# Patient Record
Sex: Female | Born: 1940 | Race: Black or African American | Hispanic: No | Marital: Married | Smoking: Never smoker
Health system: Southern US, Community
[De-identification: ages and names within clinical notes are randomized; demographics above are authoritative.]

## PROBLEM LIST (undated history)

## (undated) DIAGNOSIS — O2230 Deep phlebothrombosis in pregnancy, unspecified trimester: Secondary | ICD-10-CM

## (undated) DIAGNOSIS — R7611 Nonspecific reaction to tuberculin skin test without active tuberculosis: Secondary | ICD-10-CM

## (undated) DIAGNOSIS — E785 Hyperlipidemia, unspecified: Secondary | ICD-10-CM

## (undated) DIAGNOSIS — M1990 Unspecified osteoarthritis, unspecified site: Secondary | ICD-10-CM

## (undated) DIAGNOSIS — G709 Myoneural disorder, unspecified: Secondary | ICD-10-CM

## (undated) DIAGNOSIS — G4733 Obstructive sleep apnea (adult) (pediatric): Secondary | ICD-10-CM

## (undated) DIAGNOSIS — I10 Essential (primary) hypertension: Secondary | ICD-10-CM

## (undated) HISTORY — DX: Nonspecific reaction to tuberculin skin test without active tuberculosis: R76.11

## (undated) HISTORY — DX: Obstructive sleep apnea (adult) (pediatric): G47.33

## (undated) HISTORY — PX: BREAST SURGERY: SHX581

## (undated) HISTORY — DX: Hyperlipidemia, unspecified: E78.5

---

## 1962-11-29 HISTORY — PX: ABDOMINAL HYSTERECTOMY: SHX81

## 1963-11-30 HISTORY — PX: CHOLECYSTECTOMY: SHX55

## 2004-01-28 ENCOUNTER — Encounter (INDEPENDENT_AMBULATORY_CARE_PROVIDER_SITE_OTHER): Payer: Self-pay | Admitting: *Deleted

## 2004-01-28 LAB — CONVERTED CEMR LAB

## 2004-03-30 ENCOUNTER — Encounter: Admission: RE | Admit: 2004-03-30 | Discharge: 2004-03-30 | Payer: Self-pay | Admitting: Sports Medicine

## 2004-04-07 ENCOUNTER — Encounter: Admission: RE | Admit: 2004-04-07 | Discharge: 2004-04-07 | Payer: Self-pay | Admitting: Family Medicine

## 2004-04-20 ENCOUNTER — Encounter: Admission: RE | Admit: 2004-04-20 | Discharge: 2004-04-20 | Payer: Self-pay | Admitting: Family Medicine

## 2004-09-15 ENCOUNTER — Ambulatory Visit: Payer: Self-pay | Admitting: Sports Medicine

## 2004-10-06 ENCOUNTER — Ambulatory Visit: Payer: Self-pay | Admitting: Family Medicine

## 2004-10-16 ENCOUNTER — Ambulatory Visit: Payer: Self-pay | Admitting: Sports Medicine

## 2004-11-09 ENCOUNTER — Emergency Department (HOSPITAL_COMMUNITY): Admission: EM | Admit: 2004-11-09 | Discharge: 2004-11-09 | Payer: Self-pay | Admitting: *Deleted

## 2004-11-16 ENCOUNTER — Ambulatory Visit: Payer: Self-pay | Admitting: Sports Medicine

## 2005-02-26 ENCOUNTER — Ambulatory Visit (HOSPITAL_COMMUNITY): Admission: RE | Admit: 2005-02-26 | Discharge: 2005-02-26 | Payer: Self-pay | Admitting: Family Medicine

## 2005-03-21 ENCOUNTER — Emergency Department (HOSPITAL_COMMUNITY): Admission: EM | Admit: 2005-03-21 | Discharge: 2005-03-21 | Payer: Self-pay | Admitting: Emergency Medicine

## 2005-03-26 ENCOUNTER — Ambulatory Visit: Payer: Self-pay | Admitting: Family Medicine

## 2005-03-30 ENCOUNTER — Ambulatory Visit: Payer: Self-pay | Admitting: Family Medicine

## 2005-06-11 ENCOUNTER — Ambulatory Visit: Payer: Self-pay | Admitting: Family Medicine

## 2005-07-12 ENCOUNTER — Ambulatory Visit: Payer: Self-pay | Admitting: Family Medicine

## 2005-08-20 ENCOUNTER — Ambulatory Visit: Payer: Self-pay | Admitting: Family Medicine

## 2005-08-30 ENCOUNTER — Ambulatory Visit: Payer: Self-pay | Admitting: Family Medicine

## 2005-09-17 ENCOUNTER — Ambulatory Visit (HOSPITAL_COMMUNITY): Admission: RE | Admit: 2005-09-17 | Discharge: 2005-09-17 | Payer: Self-pay | Admitting: Gastroenterology

## 2005-09-17 ENCOUNTER — Encounter (INDEPENDENT_AMBULATORY_CARE_PROVIDER_SITE_OTHER): Payer: Self-pay | Admitting: Specialist

## 2006-01-31 ENCOUNTER — Ambulatory Visit: Payer: Self-pay | Admitting: Sports Medicine

## 2006-02-26 ENCOUNTER — Emergency Department (HOSPITAL_COMMUNITY): Admission: EM | Admit: 2006-02-26 | Discharge: 2006-02-27 | Payer: Self-pay | Admitting: Emergency Medicine

## 2006-03-24 ENCOUNTER — Ambulatory Visit: Payer: Self-pay | Admitting: Family Medicine

## 2006-03-25 ENCOUNTER — Encounter: Admission: RE | Admit: 2006-03-25 | Discharge: 2006-03-25 | Payer: Self-pay | Admitting: Sports Medicine

## 2006-04-07 ENCOUNTER — Ambulatory Visit: Payer: Self-pay | Admitting: Family Medicine

## 2006-04-26 ENCOUNTER — Ambulatory Visit (HOSPITAL_COMMUNITY): Admission: RE | Admit: 2006-04-26 | Discharge: 2006-04-26 | Payer: Self-pay | Admitting: Sports Medicine

## 2006-05-09 ENCOUNTER — Ambulatory Visit: Payer: Self-pay | Admitting: Sports Medicine

## 2006-07-29 ENCOUNTER — Ambulatory Visit: Payer: Self-pay | Admitting: Family Medicine

## 2006-09-23 ENCOUNTER — Emergency Department (HOSPITAL_COMMUNITY): Admission: EM | Admit: 2006-09-23 | Discharge: 2006-09-23 | Payer: Self-pay | Admitting: Family Medicine

## 2007-01-26 DIAGNOSIS — M25519 Pain in unspecified shoulder: Secondary | ICD-10-CM

## 2007-01-26 DIAGNOSIS — E669 Obesity, unspecified: Secondary | ICD-10-CM

## 2007-01-26 DIAGNOSIS — K210 Gastro-esophageal reflux disease with esophagitis: Secondary | ICD-10-CM

## 2007-01-26 DIAGNOSIS — I10 Essential (primary) hypertension: Secondary | ICD-10-CM

## 2007-01-26 DIAGNOSIS — J309 Allergic rhinitis, unspecified: Secondary | ICD-10-CM | POA: Insufficient documentation

## 2007-01-27 ENCOUNTER — Encounter (INDEPENDENT_AMBULATORY_CARE_PROVIDER_SITE_OTHER): Payer: Self-pay | Admitting: *Deleted

## 2007-05-01 ENCOUNTER — Ambulatory Visit (HOSPITAL_COMMUNITY): Admission: RE | Admit: 2007-05-01 | Discharge: 2007-05-01 | Payer: Self-pay | Admitting: Internal Medicine

## 2007-05-16 ENCOUNTER — Ambulatory Visit (HOSPITAL_BASED_OUTPATIENT_CLINIC_OR_DEPARTMENT_OTHER): Admission: RE | Admit: 2007-05-16 | Discharge: 2007-05-16 | Payer: Self-pay | Admitting: Internal Medicine

## 2007-05-21 ENCOUNTER — Ambulatory Visit: Payer: Self-pay | Admitting: Internal Medicine

## 2007-07-19 ENCOUNTER — Ambulatory Visit (HOSPITAL_BASED_OUTPATIENT_CLINIC_OR_DEPARTMENT_OTHER): Admission: RE | Admit: 2007-07-19 | Discharge: 2007-07-19 | Payer: Self-pay | Admitting: Internal Medicine

## 2007-07-23 ENCOUNTER — Ambulatory Visit: Payer: Self-pay | Admitting: Internal Medicine

## 2008-04-17 ENCOUNTER — Emergency Department (HOSPITAL_COMMUNITY): Admission: EM | Admit: 2008-04-17 | Discharge: 2008-04-17 | Payer: Self-pay | Admitting: Family Medicine

## 2008-04-30 ENCOUNTER — Ambulatory Visit (HOSPITAL_COMMUNITY): Admission: RE | Admit: 2008-04-30 | Discharge: 2008-04-30 | Payer: Self-pay | Admitting: Internal Medicine

## 2008-05-26 ENCOUNTER — Emergency Department (HOSPITAL_COMMUNITY): Admission: EM | Admit: 2008-05-26 | Discharge: 2008-05-26 | Payer: Self-pay | Admitting: Emergency Medicine

## 2008-07-20 ENCOUNTER — Emergency Department (HOSPITAL_COMMUNITY): Admission: EM | Admit: 2008-07-20 | Discharge: 2008-07-20 | Payer: Self-pay | Admitting: Emergency Medicine

## 2008-09-19 ENCOUNTER — Ambulatory Visit (HOSPITAL_BASED_OUTPATIENT_CLINIC_OR_DEPARTMENT_OTHER): Admission: RE | Admit: 2008-09-19 | Discharge: 2008-09-19 | Payer: Self-pay | Admitting: Orthopedic Surgery

## 2008-12-25 ENCOUNTER — Emergency Department (HOSPITAL_COMMUNITY): Admission: EM | Admit: 2008-12-25 | Discharge: 2008-12-25 | Payer: Self-pay | Admitting: Emergency Medicine

## 2009-05-02 ENCOUNTER — Ambulatory Visit (HOSPITAL_COMMUNITY): Admission: RE | Admit: 2009-05-02 | Discharge: 2009-05-02 | Payer: Self-pay | Admitting: Internal Medicine

## 2009-05-08 ENCOUNTER — Encounter: Admission: RE | Admit: 2009-05-08 | Discharge: 2009-05-08 | Payer: Self-pay | Admitting: Otolaryngology

## 2009-09-17 ENCOUNTER — Encounter: Admission: RE | Admit: 2009-09-17 | Discharge: 2009-10-22 | Payer: Self-pay | Admitting: Rheumatology

## 2009-11-04 ENCOUNTER — Emergency Department (HOSPITAL_COMMUNITY): Admission: EM | Admit: 2009-11-04 | Discharge: 2009-11-04 | Payer: Self-pay | Admitting: Emergency Medicine

## 2010-02-13 ENCOUNTER — Emergency Department (HOSPITAL_COMMUNITY): Admission: EM | Admit: 2010-02-13 | Discharge: 2010-02-13 | Payer: Self-pay | Admitting: Emergency Medicine

## 2010-05-04 ENCOUNTER — Ambulatory Visit (HOSPITAL_COMMUNITY): Admission: RE | Admit: 2010-05-04 | Discharge: 2010-05-04 | Payer: Self-pay | Admitting: Internal Medicine

## 2010-11-16 ENCOUNTER — Emergency Department (HOSPITAL_COMMUNITY)
Admission: EM | Admit: 2010-11-16 | Discharge: 2010-11-16 | Payer: Self-pay | Source: Home / Self Care | Admitting: Family Medicine

## 2010-12-04 ENCOUNTER — Encounter (INDEPENDENT_AMBULATORY_CARE_PROVIDER_SITE_OTHER): Payer: Self-pay | Admitting: Emergency Medicine

## 2010-12-04 ENCOUNTER — Emergency Department (HOSPITAL_COMMUNITY)
Admission: EM | Admit: 2010-12-04 | Discharge: 2010-12-04 | Payer: Self-pay | Source: Home / Self Care | Admitting: Emergency Medicine

## 2010-12-28 DIAGNOSIS — A150 Tuberculosis of lung: Secondary | ICD-10-CM | POA: Insufficient documentation

## 2011-01-14 ENCOUNTER — Encounter: Payer: Self-pay | Admitting: Infectious Diseases

## 2011-01-27 ENCOUNTER — Encounter (INDEPENDENT_AMBULATORY_CARE_PROVIDER_SITE_OTHER): Payer: Self-pay | Admitting: *Deleted

## 2011-01-27 DIAGNOSIS — M069 Rheumatoid arthritis, unspecified: Secondary | ICD-10-CM | POA: Insufficient documentation

## 2011-01-28 ENCOUNTER — Encounter (INDEPENDENT_AMBULATORY_CARE_PROVIDER_SITE_OTHER): Payer: Self-pay | Admitting: *Deleted

## 2011-01-28 ENCOUNTER — Encounter: Payer: Self-pay | Admitting: Infectious Diseases

## 2011-01-28 ENCOUNTER — Ambulatory Visit (INDEPENDENT_AMBULATORY_CARE_PROVIDER_SITE_OTHER): Payer: Medicare Other | Admitting: Infectious Diseases

## 2011-01-28 DIAGNOSIS — E119 Type 2 diabetes mellitus without complications: Secondary | ICD-10-CM | POA: Insufficient documentation

## 2011-01-28 DIAGNOSIS — I499 Cardiac arrhythmia, unspecified: Secondary | ICD-10-CM

## 2011-01-28 DIAGNOSIS — A150 Tuberculosis of lung: Secondary | ICD-10-CM

## 2011-02-04 NOTE — Assessment & Plan Note (Signed)
Summary: New pt + TB/Dr. Anderson/Recordsrecdtorcid/kam   Vital Signs:  Patient profile:   70 year old female Weight:      222 pounds (100.91 kg) Temp:     98.2 degrees F (36.78 degrees C) oral Pulse rate:   105 / minute BP sitting:   170 / 74  (left arm)  Vitals Entered By: Starleen Arms CMA (January 28, 2011 2:08 PM) CC: new pt referral for TB Is Patient Diabetic? Yes Did you bring your meter with you today? No Pain Assessment Patient in pain? no       Does patient need assistance? Functional Status Self care Ambulation Normal   CC:  new pt referral for TB.  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 She was scheduled to start Remicade and prior to this she had a positive Quantiferon gold. She has previously taken Isoniazid for 9 months (2010).   ROS- no fevers, no chills, has had cough for  ~ 9 months, non-productive, wt steady but she feels like it has redistributed, no lymphadenopathy, constipated most of the time, pass urine too much (takes lasix, enablex). can't remember last CXR. (Dec 04, 2010 CXR- normal).   Preventive Screening-Counseling & Management  Alcohol-Tobacco     Alcohol drinks/day: 0     Smoking Status: never  Current Medications (verified): 1)  Methotrexate 2.5 Mg Tabs (Methotrexate Sodium) .... 20 Mg Once Weekly 2)  Folic Acid Xtra  Tabs (Folic Acid-B2-B6-B12-C-Choline) 3)  Omeprazole 20 Mg Tbec (Omeprazole) .... Take 1 Tablet By Mouth Two Times A Day 4)  Enablex 7.5 Mg Xr24h-Tab (Darifenacin Hydrobromide) .... Take 1 Tablet By Mouth Once A Day 5)  Metformin Hcl 500 Mg Tabs (Metformin Hcl) .... Take 1 Tablet By Mouth Once A Day 6)  Ecotrin Low Strength 81 Mg Tbec (Aspirin) .... Take 1 Tablet By Mouth Once A Day 7)  Furosemide 20 Mg Tabs (Furosemide) .... Take 1 Tablet By Mouth Once A Day 8)  Hyzaar 100-12.5 Mg Tabs (Losartan Potassium-Hctz) .... Take 1 Tablet By Mouth Once A Day 9)  Centrum  Silver  Tabs (Multiple Vitamins-Minerals) .... Take 1 Tablet By Mouth Once A Day 10)  Polyethylene Glycol 1450  Liqd (Polyethylene Glycol 1450) .... As Needed For Constipation. 11)  Cyanocobalamin 1000 Mcg/ml Soln (Cyanocobalamin) .Marland Kitchen.. 10 Cc Per Day  Allergies (verified): No Known Drug Allergies  Review of Systems       has headaches. states she had an MRI of her head due to "sound of craking knuckles in her head". lasts 5 minutes then resolves.   Physical Exam  General:  well-developed, well-nourished, and well-hydrated.   Eyes:  pupils equal and pupils round.   Mouth:  pharynx pink and moist and no exudates.   Neck:  no masses.   Lungs:  normal respiratory effort and normal breath sounds.   Heart:  normal rate, no murmur, and irregular rhythm.   Abdomen:  soft, non-tender, and normal bowel sounds.   Cervical Nodes:  no anterior cervical adenopathy and no posterior cervical adenopathy.   Axillary Nodes:  no R axillary adenopathy and no L axillary adenopathy.     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  Review of Uptodate shows that Quantiferon gold testing is of limited utility in determining adequacy of treatment response in patients. Some patients will convert to negative  after treatment, others will not. I would favor considering her treated, she has a negative CXR, and not treat her again unless she has any symptoms. she can return to clinic as needed  The following medications were removed from the medication list:    Isoniazid 300 Mg Tabs (Isoniazid) .Marland Kitchen... 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 This is confirmed today in clinic. WIll check her ECG (sinus arrhythmia)  and have her f/u ASAP with her PCP Dr Jarold Motto  ( I spoke with  him, she will see him this afternoon). My great appreciation to him.  Her updated medication list for this problem includes:    Ecotrin Low Strength 81 Mg Tbec (Aspirin) .Marland Kitchen... Take 1 tablet by mouth once a day  Orders: Consultation Level IV (99244) 12 Lead EKG (12 Lead EKG)  Medications Added to Medication List This Visit: 1)  Centrum Silver Tabs (Multiple vitamins-minerals) .... Take 1 tablet by mouth once a day 2)  Polyethylene Glycol 1450 Liqd (Polyethylene glycol 1450) .... As needed for constipation. 3)  Cyanocobalamin 1000 Mcg/ml Soln (Cyanocobalamin) .Marland Kitchen.. 10 cc per day   Orders Added: 1)  Consultation Level IV [99244] 2)  12 Lead EKG [12 Lead EKG]

## 2011-02-04 NOTE — Miscellaneous (Signed)
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

## 2011-02-04 NOTE — Miscellaneous (Signed)
Summary: med list                                        Clinical Lists Changes  Problems: Added new problem of DM (ICD-250.00) Medications: Added new medication of OMEPRAZOLE 20 MG TBEC (OMEPRAZOLE) Take 1 tablet by mouth two times a day Added new medication of CYMBALTA 60 MG CPEP (DULOXETINE HCL) Take 1 tablet by mouth once a day Added new medication of LISINOPRIL-HYDROCHLOROTHIAZIDE 20-12.5 MG TABS (LISINOPRIL-HYDROCHLOROTHIAZIDE) Take 1 tablet by mouth once a day Added new medication of DILTIAZEM HCL CR 120 MG XR24H-CAP (DILTIAZEM HCL) Take 1 capsule by mouth once a day Added new medication of ENABLEX 7.5 MG XR24H-TAB (DARIFENACIN HYDROBROMIDE) Take 1 tablet by mouth once a day Added new medication of METFORMIN HCL 500 MG TABS (METFORMIN HCL) Take 1 tablet by mouth once a day Added new medication of XYZAL 5 MG TABS (LEVOCETIRIZINE DIHYDROCHLORIDE) Take 1 tablet by mouth once a day Added new medication of ECOTRIN LOW STRENGTH 81 MG TBEC (ASPIRIN) Take 1 tablet by mouth once a day Added new medication of REQUIP 2 MG TABS (ROPINIROLE HCL) take two tablets one time a day Added new medication of ZYRTEC ALLERGY 10 MG CAPS (CETIRIZINE HCL) as directed Added new medication of ISONIAZID 300 MG TABS (ISONIAZID) Take 1 tablet by mouth once a day Added new medication of PROAIR HFA 108 (90 BASE) MCG/ACT AERS (ALBUTEROL SULFATE) as directed Added new medication of BENICAR 20 MG TABS (OLMESARTAN MEDOXOMIL) Take 1 tablet by mouth once a day Added new medication of FUROSEMIDE 20 MG TABS (FUROSEMIDE) Take 1 tablet by mouth once a day Added new medication of HYZAAR 100-12.5 MG TABS (LOSARTAN POTASSIUM-HCTZ) Take 1 tablet by mouth once a day Added new medication of GABAPENTIN 300 MG CAPS (GABAPENTIN) Take 1 capsule by mouth two times a day Added new medication of ALLOPURINOL 100 MG TABS (ALLOPURINOL) Take 1 tablet by mouth once a day

## 2011-02-05 ENCOUNTER — Encounter: Payer: Self-pay | Admitting: *Deleted

## 2011-02-21 LAB — URINALYSIS, ROUTINE W REFLEX MICROSCOPIC
Bilirubin Urine: NEGATIVE
Glucose, UA: NEGATIVE mg/dL
Ketones, ur: NEGATIVE mg/dL
Leukocytes, UA: NEGATIVE
Nitrite: NEGATIVE
Protein, ur: NEGATIVE mg/dL
Specific Gravity, Urine: 1.019 (ref 1.005–1.030)
Urobilinogen, UA: 1 mg/dL (ref 0.0–1.0)
pH: 7 (ref 5.0–8.0)

## 2011-02-21 LAB — DIFFERENTIAL
Basophils Relative: 0 % (ref 0–1)
Eosinophils Absolute: 0.1 10*3/uL (ref 0.0–0.7)
Monocytes Absolute: 0.5 10*3/uL (ref 0.1–1.0)
Monocytes Relative: 8 % (ref 3–12)
Neutrophils Relative %: 66 % (ref 43–77)

## 2011-02-21 LAB — CBC
Hemoglobin: 12.1 g/dL (ref 12.0–15.0)
RDW: 16.3 % — ABNORMAL HIGH (ref 11.5–15.5)
WBC: 6.6 10*3/uL (ref 4.0–10.5)

## 2011-02-21 LAB — COMPREHENSIVE METABOLIC PANEL
ALT: 29 U/L (ref 0–35)
Albumin: 3.1 g/dL — ABNORMAL LOW (ref 3.5–5.2)
Alkaline Phosphatase: 78 U/L (ref 39–117)
Chloride: 103 mEq/L (ref 96–112)
Glucose, Bld: 216 mg/dL — ABNORMAL HIGH (ref 70–99)
Potassium: 3.7 mEq/L (ref 3.5–5.1)
Sodium: 139 mEq/L (ref 135–145)
Total Bilirubin: 0.4 mg/dL (ref 0.3–1.2)
Total Protein: 6.5 g/dL (ref 6.0–8.3)

## 2011-02-21 LAB — URINE MICROSCOPIC-ADD ON

## 2011-02-21 LAB — POCT I-STAT, CHEM 8
BUN: 13 mg/dL (ref 6–23)
Calcium, Ion: 1.14 mmol/L (ref 1.12–1.32)
Chloride: 103 mEq/L (ref 96–112)
Glucose, Bld: 213 mg/dL — ABNORMAL HIGH (ref 70–99)

## 2011-02-21 LAB — POCT CARDIAC MARKERS: Troponin i, poc: <0.05 ng/mL (ref 0.00–0.09)

## 2011-03-29 ENCOUNTER — Other Ambulatory Visit (HOSPITAL_BASED_OUTPATIENT_CLINIC_OR_DEPARTMENT_OTHER): Payer: Self-pay | Admitting: Internal Medicine

## 2011-03-29 DIAGNOSIS — Z1231 Encounter for screening mammogram for malignant neoplasm of breast: Secondary | ICD-10-CM

## 2011-04-13 NOTE — Procedures (Signed)
NAME:  Jenna Foster, Jenna Foster NO.:  [id number]   MEDICAL RECORD NO.:  [id number]          PATIENT TYPE:  OUT   LOCATION:  SLEEP CENTER                 FACILITY:  [facility]   PHYSICIAN:  Clinton D. Maple Hudson, MD, FCCP, FACPDATE OF BIRTH:  06/28[birth year]1942   DATE OF STUDY:  07/19/2007                            NOCTURNAL POLYSOMNOGRAM   REFERRING PHYSICIAN:   REFERRING PHYSICIAN:  Fleet Contras MD   INDICATION FOR STUDY:  Hypersomnia with sleep apnea.   EPWORTH SLEEPINESS SCORE:  18/24.  BMI 39.9.  Weight 234 pounds.   HOME MEDICATIONS:  Listed and reviewed.   A diagnostic NPSG on May 16, 2007 recorded AHI 99.2 per hour.  CPAP  titration study planned.   SLEEP ARCHITECTURE:  Total sleep time 342 minutes with sleep efficiency  76%.  Stage I was 6%, stage II 72%, stage III absent, REM 22% of total  sleep time.  Sleep latency 67 minutes.  REM latency 79 minutes.  Awake  after sleep onset 44 minutes.  Arousal index 14.4.  Ambien CR 6.25 mg  x2, taken at 9:50 p.m.   RESPIRATORY DATA:  CPAP titration protocol.  CPAP was titrated to 23-CWP  with residual breakthrough events at all pressures.  The patient  experienced discomfort with pressure leaks at higher pressure settings.  Breakthrough higher pressures was mostly related to hypopneas.  Best  final pressure 21-CWP.  AHI zero.   OXYGEN DATA:  Snoring was suppressed at final pressures with CPAP  holding oxygen saturation at 97% on room air.   CARDIAC DATA:  Normal sinus rhythm.   MOVEMENT-PARASOMNIA:  Occasional limb jerk with little effect on sleep.   IMPRESSIONS-RECOMMENDATIONS:  1. CPAP titration to suggested pressure 12-CWP, AHI zero per hour.  A      medium comfort gel full face mask was      used with heated humidifier.  2. Diagnostic NPSG on May 16, 2007, recorded AHI 9.2 per hour.      Clinton D. Maple Hudson, MD, [facility], FACP  Diplomate, Biomedical engineer of Sleep Medicine  Electronically Signed     CDY/MEDQ  D:   07/22/2007 13:24:25  T:  07/23/2007 15:36:26  Job:  [id number]

## 2011-04-13 NOTE — Procedures (Signed)
NAME:  Jenna Foster, Jenna Foster NO.:  [id number]   MEDICAL RECORD NO.:  [id number]          PATIENT TYPE:  OUT   LOCATION:  SLEEP CENTER                 FACILITY:  [facility]   PHYSICIAN:  Clinton D. Maple Hudson, MD, FCCP, FACPDATE OF BIRTH:  03[birth year]/42   DATE OF STUDY:  05/16/2007                            NOCTURNAL POLYSOMNOGRAM   REFERRING PHYSICIAN:  Fleet Contras, M.D.   INDICATIONS FOR PROCEDURE:  Hypersomnia with sleep apnea.   RESULTS:  Epward sleepiness score 17/24, BMI 39.5, weight 232 pounds.   MEDICATIONS:  Home medications are listed and reviewed.   SLEEP ARCHITECTURE:  Total sleep time 286 minutes with sleep efficiency  69%. Stage 1 was 8%; Stage 2, 75%; Stages 3 and 4, absent. REM 17% of  total sleep time. Sleep latency 51 minutes, REM latency 111 minutes.  Awake after sleep onset, 79 minutes. Arousal index 17.6. No bedtime  medication was taken.   RESPIRATORY DATA:  Apnea hypopnea index (AHI, RDI) 9.2 obstructive  events per hour, indicating mild obstructive sleep apnea/hypopnea  syndrome. There were 2 obstructive apnea's, 1 mixed apnea, and 41  hypopnea's. Events were not positional. REM AHI 40. There were  insufficient events to permit the use of CPAP titration by split  protocol on this study night.   OXYGEN DATA:  Moderate snoring with oxygen desaturation to a nadir of  89%. Mean oxygen saturation through the study was 97% on room air.   CARDIAC DATA:  Sinus rhythm with occasional mild sinus tachycardia.   MOVEMENT/PARASOMNIA:  Occasional limb jerk with little effect on sleep.  No bathroom trips.   IMPRESSION/RECOMMENDATIONS:  1. Short total sleep time, non-specific.  2. Mild obstructive sleep apnea/hypopnea syndrome, AHI 9.2 per hour      with non-positional events. Most sleep was supine. Moderate snoring      with oxygen desaturation to a nadir of 89%.  3. Scores in this range may be addressed with CPAP therapy if      conservative measures such as  weight loss,      treatment for nasal congestion, and sleeping off flat of the back      are insufficient. Consider return to CPAP titration if appropriate.      Clinton D. Maple Hudson, MD, [facility], FACP  Diplomate, Biomedical engineer of Sleep Medicine  Electronically Signed     CDY/MEDQ  D:  05/21/2007 11:14:14  T:  05/21/2007 12:56:46  Job:  [id number]

## 2011-05-07 ENCOUNTER — Ambulatory Visit (HOSPITAL_COMMUNITY)
Admission: RE | Admit: 2011-05-07 | Discharge: 2011-05-07 | Disposition: A | Discharge status: Home / Self Care | Payer: Medicare Other | Source: Ambulatory Visit | Attending: Internal Medicine | Admitting: Internal Medicine

## 2011-05-07 DIAGNOSIS — Z1231 Encounter for screening mammogram for malignant neoplasm of breast: Secondary | ICD-10-CM | POA: Insufficient documentation

## 2011-08-26 LAB — URINALYSIS, ROUTINE W REFLEX MICROSCOPIC
Nitrite: NEGATIVE
Specific Gravity, Urine: 1.011
pH: 7.5

## 2011-08-26 LAB — DIFFERENTIAL
Basophils Absolute: 0
Lymphocytes Relative: 14
Monocytes Absolute: 0.6
Neutro Abs: 7.5

## 2011-08-26 LAB — POCT I-STAT, CHEM 8
BUN: 12
Calcium, Ion: 1.15
Chloride: 98

## 2011-08-26 LAB — CBC
Hemoglobin: 12.8
RBC: 4.61
RDW: 14.4

## 2011-08-30 LAB — BASIC METABOLIC PANEL
CO2: 28
Chloride: 101
Creatinine, Ser: 0.7
GFR calc Af Amer: >60
Potassium: 4.1
Sodium: 136

## 2011-08-30 LAB — GLUCOSE, CAPILLARY: Glucose-Capillary: 111 — ABNORMAL HIGH

## 2011-10-07 ENCOUNTER — Encounter: Payer: Self-pay | Admitting: *Deleted

## 2011-10-07 ENCOUNTER — Emergency Department (INDEPENDENT_AMBULATORY_CARE_PROVIDER_SITE_OTHER)
Admission: EM | Admit: 2011-10-07 | Discharge: 2011-10-07 | Disposition: A | Discharge status: Home / Self Care | Payer: Medicare Other | Source: Home / Self Care

## 2011-10-07 DIAGNOSIS — L304 Erythema intertrigo: Secondary | ICD-10-CM

## 2011-10-07 DIAGNOSIS — L538 Other specified erythematous conditions: Secondary | ICD-10-CM

## 2011-10-07 HISTORY — DX: Essential (primary) hypertension: I10

## 2011-10-07 MED ORDER — MUPIROCIN CALCIUM 2 % EX CREA: TOPICAL_CREAM | Freq: Three times a day (TID) | CUTANEOUS | Status: AC

## 2011-10-07 MED ORDER — CLOTRIMAZOLE-BETAMETHASONE 1-0.05 % EX CREA: TOPICAL_CREAM | CUTANEOUS | Status: AC

## 2011-10-07 NOTE — ED Provider Notes (Signed)
History     CSN: [id number] Arrival date & time: 10/07/2011  2:45 PM   First MD Initiated Contact with Patient 10/07/11 1622      Chief Complaint  Patient presents with  . Rash   Pt with painful rash in bilateral groin starting 5 days ago. States area initially red then started "peeling." states rash now "smells."  no prodromal sx, blisters, N/V, fevers, other rash. States glucose has been WNL for her. Wears Depends. No new lotions, soaps, detergents, other skin irritants, new medications.  Patient is a 70 y.o. female presenting with rash. The history is provided by the patient. No language interpreter was used.  Rash  This is a new problem. The current episode started more than 2 days ago. The problem has been gradually worsening. The problem is associated with nothing. There has been no fever. The rash is present on the groin. Associated symptoms include pain and weeping. Pertinent negatives include no itching. Treatments tried: vaseline. The treatment provided no relief.    Past Medical History  Diagnosis Date  . Diabetes mellitus   . Hypertension   . Asthma     Past Surgical History  Procedure Date  . Cholecystectomy   . Abdominal hysterectomy     Family History  Problem Relation Age of Onset  . Diabetes Mother   . Hypertension Mother     History  Substance Use Topics  . Smoking status: Never Smoker   . Smokeless tobacco: Not on file  . Alcohol Use: No    OB History    Grav Para Term Preterm Abortions TAB SAB Ect Mult Living                  Review of Systems  Constitutional: Negative for fever.  HENT: Negative for facial swelling, drooling, trouble swallowing and voice change.   Eyes: Negative for redness.  Respiratory: Negative for chest tightness, shortness of breath and wheezing.   Cardiovascular: Negative for chest pain.  Gastrointestinal: Negative for nausea, vomiting and diarrhea.  Musculoskeletal: Negative for myalgias and arthralgias.  Skin:  Positive for rash. Negative for itching.  Neurological: Negative for weakness and numbness.  Hematological: Does not bruise/bleed easily.    Allergies  Bactrim  Home Medications   Current Outpatient Rx  Name Route Sig Dispense Refill  . ASPIRIN 81 MG PO TBEC Oral Take 81 mg by mouth daily.      Marland Kitchen DICLOFENAC POTASSIUM 50 MG PO TABS Oral Take 50 mg by mouth 3 (three) times daily.      Marland Kitchen FOLIC ACID XTRA PO TABS       . GABAPENTIN (PHN) PO Oral Take by mouth.      Marland Kitchen LEVOTHYROXINE SODIUM 50 MCG PO CAPS Oral Take by mouth.      Marland Kitchen LOSARTAN POTASSIUM-HCTZ 100-12.5 MG PO TABS Oral Take 1 tablet by mouth daily.      Marland Kitchen METFORMIN HCL 500 MG PO TABS Oral Take 500 mg by mouth daily.      Marland Kitchen METHOTREXATE 2.5 MG PO TABS Oral Take 20 mg by mouth once a week. Caution:Chemotherapy. Protect from light.     . CENTRUM SILVER PO Oral Take 1 tablet by mouth daily.      Marland Kitchen OMEPRAZOLE 20 MG PO CPDR Oral Take 20 mg by mouth 2 (two) times daily.      Marland Kitchen POLYETHYLENE GLYCOL 1450 LIQD  as needed. for constipation     . CLOTRIMAZOLE-BETAMETHASONE 1-0.05 % EX CREA  Apply topically  to affected area bid 15 g 0  . MUPIROCIN CALCIUM 2 % EX CREA Topical Apply topically 3 (three) times daily. 15 g 0    BP 149/62  Pulse 86  Temp(Src) 98.9 F (37.2 C) (Oral)  Resp 18  SpO2 100%  Physical Exam  Nursing note and vitals reviewed. Constitutional: She is oriented to person, place, and time. She appears well-developed and well-nourished.  HENT:  Head: Normocephalic and atraumatic.  Eyes: Conjunctivae and EOM are normal. Pupils are equal, round, and reactive to light.  Neck: Normal range of motion.  Cardiovascular: Normal rate and regular rhythm.   No murmur heard. Pulmonary/Chest: Effort normal and breath sounds normal.  Abdominal: Soft. Bowel sounds are normal. She exhibits no distension. There is no tenderness.  Musculoskeletal: Normal range of motion.  Neurological: She is alert and oriented to person, place, and  time.  Skin: Rash noted.       biltateral mildly tender erythematous ulcerated areas under pannus (+) odor. Nontender. No blisters, no inguinal LN.  No rash anywhere else on body.  Psychiatric: She has a normal mood and affect. Her behavior is normal. Judgment and thought content normal.    ED Course  Procedures (including critical care time)  Labs Reviewed - No data to display No results found.   1. Intertrigo       MDM  Advised pt to start using antibacteriol soap, keep area as dry as possible, to avoid tight garments, to alternate lotrisone and Idelle Jo     Luiz Blare 10/07/11 1657

## 2011-10-07 NOTE — ED Notes (Signed)
Pt  Has  Rash  On lower  abd   Small    Sores   Present  Pt  States   -   The  Symptoms   Began     5  Days  Ago          No  Known  Causative agents

## 2011-10-08 ENCOUNTER — Telehealth (HOSPITAL_COMMUNITY): Payer: Self-pay | Admitting: *Deleted

## 2011-10-25 ENCOUNTER — Other Ambulatory Visit (HOSPITAL_COMMUNITY): Payer: Self-pay | Admitting: Rheumatology

## 2011-10-25 DIAGNOSIS — R0602 Shortness of breath: Secondary | ICD-10-CM

## 2011-11-01 ENCOUNTER — Ambulatory Visit (HOSPITAL_COMMUNITY)
Admission: RE | Admit: 2011-11-01 | Discharge: 2011-11-01 | Disposition: A | Discharge status: Home / Self Care | Payer: Medicare Other | Source: Ambulatory Visit | Attending: Rheumatology | Admitting: Rheumatology

## 2011-11-01 DIAGNOSIS — R0602 Shortness of breath: Secondary | ICD-10-CM | POA: Insufficient documentation

## 2011-12-10 DIAGNOSIS — M65979 Unspecified synovitis and tenosynovitis, unspecified ankle and foot: Secondary | ICD-10-CM | POA: Diagnosis not present

## 2011-12-10 DIAGNOSIS — M659 Synovitis and tenosynovitis, unspecified: Secondary | ICD-10-CM | POA: Diagnosis not present

## 2011-12-21 DIAGNOSIS — N3946 Mixed incontinence: Secondary | ICD-10-CM | POA: Diagnosis not present

## 2011-12-21 DIAGNOSIS — R279 Unspecified lack of coordination: Secondary | ICD-10-CM | POA: Diagnosis not present

## 2011-12-21 DIAGNOSIS — R350 Frequency of micturition: Secondary | ICD-10-CM | POA: Diagnosis not present

## 2011-12-21 DIAGNOSIS — M6281 Muscle weakness (generalized): Secondary | ICD-10-CM | POA: Diagnosis not present

## 2012-01-04 DIAGNOSIS — M069 Rheumatoid arthritis, unspecified: Secondary | ICD-10-CM | POA: Diagnosis not present

## 2012-01-04 DIAGNOSIS — Z79899 Other long term (current) drug therapy: Secondary | ICD-10-CM | POA: Diagnosis not present

## 2012-02-02 DIAGNOSIS — M6281 Muscle weakness (generalized): Secondary | ICD-10-CM | POA: Diagnosis not present

## 2012-02-02 DIAGNOSIS — R350 Frequency of micturition: Secondary | ICD-10-CM | POA: Diagnosis not present

## 2012-02-02 DIAGNOSIS — N3946 Mixed incontinence: Secondary | ICD-10-CM | POA: Diagnosis not present

## 2012-02-02 DIAGNOSIS — R279 Unspecified lack of coordination: Secondary | ICD-10-CM | POA: Diagnosis not present

## 2012-02-07 DIAGNOSIS — I739 Peripheral vascular disease, unspecified: Secondary | ICD-10-CM | POA: Diagnosis not present

## 2012-02-07 DIAGNOSIS — M79609 Pain in unspecified limb: Secondary | ICD-10-CM | POA: Diagnosis not present

## 2012-02-07 DIAGNOSIS — B351 Tinea unguium: Secondary | ICD-10-CM | POA: Diagnosis not present

## 2012-02-07 DIAGNOSIS — E1159 Type 2 diabetes mellitus with other circulatory complications: Secondary | ICD-10-CM | POA: Diagnosis not present

## 2012-02-07 DIAGNOSIS — L608 Other nail disorders: Secondary | ICD-10-CM | POA: Diagnosis not present

## 2012-02-11 DIAGNOSIS — I739 Peripheral vascular disease, unspecified: Secondary | ICD-10-CM | POA: Diagnosis not present

## 2012-02-11 DIAGNOSIS — E1159 Type 2 diabetes mellitus with other circulatory complications: Secondary | ICD-10-CM | POA: Diagnosis not present

## 2012-02-11 DIAGNOSIS — I10 Essential (primary) hypertension: Secondary | ICD-10-CM | POA: Diagnosis not present

## 2012-02-11 DIAGNOSIS — R609 Edema, unspecified: Secondary | ICD-10-CM | POA: Diagnosis not present

## 2012-02-16 DIAGNOSIS — M069 Rheumatoid arthritis, unspecified: Secondary | ICD-10-CM | POA: Diagnosis not present

## 2012-02-22 DIAGNOSIS — M069 Rheumatoid arthritis, unspecified: Secondary | ICD-10-CM | POA: Diagnosis not present

## 2012-03-15 DIAGNOSIS — R350 Frequency of micturition: Secondary | ICD-10-CM | POA: Diagnosis not present

## 2012-03-15 DIAGNOSIS — M6281 Muscle weakness (generalized): Secondary | ICD-10-CM | POA: Diagnosis not present

## 2012-03-15 DIAGNOSIS — R279 Unspecified lack of coordination: Secondary | ICD-10-CM | POA: Diagnosis not present

## 2012-03-15 DIAGNOSIS — N3946 Mixed incontinence: Secondary | ICD-10-CM | POA: Diagnosis not present

## 2012-03-22 DIAGNOSIS — E039 Hypothyroidism, unspecified: Secondary | ICD-10-CM | POA: Diagnosis not present

## 2012-03-22 DIAGNOSIS — E041 Nontoxic single thyroid nodule: Secondary | ICD-10-CM | POA: Diagnosis not present

## 2012-03-30 DIAGNOSIS — IMO0002 Reserved for concepts with insufficient information to code with codable children: Secondary | ICD-10-CM | POA: Diagnosis not present

## 2012-03-30 DIAGNOSIS — M5412 Radiculopathy, cervical region: Secondary | ICD-10-CM | POA: Diagnosis not present

## 2012-03-30 DIAGNOSIS — G5610 Other lesions of median nerve, unspecified upper limb: Secondary | ICD-10-CM | POA: Diagnosis not present

## 2012-03-30 DIAGNOSIS — G609 Hereditary and idiopathic neuropathy, unspecified: Secondary | ICD-10-CM | POA: Diagnosis not present

## 2012-04-11 DIAGNOSIS — M069 Rheumatoid arthritis, unspecified: Secondary | ICD-10-CM | POA: Diagnosis not present

## 2012-04-20 ENCOUNTER — Other Ambulatory Visit: Payer: Self-pay | Admitting: Internal Medicine

## 2012-04-20 DIAGNOSIS — Z1231 Encounter for screening mammogram for malignant neoplasm of breast: Secondary | ICD-10-CM

## 2012-04-21 ENCOUNTER — Ambulatory Visit
Admission: RE | Admit: 2012-04-21 | Discharge: 2012-04-21 | Disposition: A | Discharge status: Home / Self Care | Payer: Medicare Other | Source: Ambulatory Visit | Attending: Internal Medicine | Admitting: Internal Medicine

## 2012-04-21 DIAGNOSIS — Z1231 Encounter for screening mammogram for malignant neoplasm of breast: Secondary | ICD-10-CM | POA: Diagnosis not present

## 2012-05-17 DIAGNOSIS — M069 Rheumatoid arthritis, unspecified: Secondary | ICD-10-CM | POA: Diagnosis not present

## 2012-05-29 DIAGNOSIS — M069 Rheumatoid arthritis, unspecified: Secondary | ICD-10-CM | POA: Diagnosis not present

## 2012-06-07 DIAGNOSIS — I10 Essential (primary) hypertension: Secondary | ICD-10-CM | POA: Diagnosis not present

## 2012-06-07 DIAGNOSIS — E1149 Type 2 diabetes mellitus with other diabetic neurological complication: Secondary | ICD-10-CM | POA: Diagnosis not present

## 2012-06-07 DIAGNOSIS — M069 Rheumatoid arthritis, unspecified: Secondary | ICD-10-CM | POA: Diagnosis not present

## 2012-06-07 DIAGNOSIS — E1159 Type 2 diabetes mellitus with other circulatory complications: Secondary | ICD-10-CM | POA: Diagnosis not present

## 2012-06-28 DIAGNOSIS — E119 Type 2 diabetes mellitus without complications: Secondary | ICD-10-CM | POA: Diagnosis not present

## 2012-07-17 DIAGNOSIS — M069 Rheumatoid arthritis, unspecified: Secondary | ICD-10-CM | POA: Diagnosis not present

## 2012-09-04 DIAGNOSIS — M069 Rheumatoid arthritis, unspecified: Secondary | ICD-10-CM | POA: Diagnosis not present

## 2012-09-18 DIAGNOSIS — L608 Other nail disorders: Secondary | ICD-10-CM | POA: Diagnosis not present

## 2012-09-18 DIAGNOSIS — E1159 Type 2 diabetes mellitus with other circulatory complications: Secondary | ICD-10-CM | POA: Diagnosis not present

## 2012-09-18 DIAGNOSIS — I739 Peripheral vascular disease, unspecified: Secondary | ICD-10-CM | POA: Diagnosis not present

## 2012-09-27 DIAGNOSIS — R0609 Other forms of dyspnea: Secondary | ICD-10-CM | POA: Diagnosis not present

## 2012-09-27 DIAGNOSIS — I10 Essential (primary) hypertension: Secondary | ICD-10-CM | POA: Diagnosis not present

## 2012-09-27 DIAGNOSIS — Z23 Encounter for immunization: Secondary | ICD-10-CM | POA: Diagnosis not present

## 2012-09-27 DIAGNOSIS — R0989 Other specified symptoms and signs involving the circulatory and respiratory systems: Secondary | ICD-10-CM | POA: Diagnosis not present

## 2012-09-27 DIAGNOSIS — E1149 Type 2 diabetes mellitus with other diabetic neurological complication: Secondary | ICD-10-CM | POA: Diagnosis not present

## 2012-10-02 DIAGNOSIS — M7989 Other specified soft tissue disorders: Secondary | ICD-10-CM | POA: Diagnosis not present

## 2012-10-23 DIAGNOSIS — M069 Rheumatoid arthritis, unspecified: Secondary | ICD-10-CM | POA: Diagnosis not present

## 2012-11-09 DIAGNOSIS — G609 Hereditary and idiopathic neuropathy, unspecified: Secondary | ICD-10-CM | POA: Diagnosis not present

## 2012-11-09 DIAGNOSIS — G5610 Other lesions of median nerve, unspecified upper limb: Secondary | ICD-10-CM | POA: Diagnosis not present

## 2012-11-09 DIAGNOSIS — IMO0002 Reserved for concepts with insufficient information to code with codable children: Secondary | ICD-10-CM | POA: Diagnosis not present

## 2012-11-09 DIAGNOSIS — G3184 Mild cognitive impairment, so stated: Secondary | ICD-10-CM | POA: Diagnosis not present

## 2012-11-09 DIAGNOSIS — R413 Other amnesia: Secondary | ICD-10-CM | POA: Diagnosis not present

## 2012-11-09 DIAGNOSIS — R209 Unspecified disturbances of skin sensation: Secondary | ICD-10-CM | POA: Diagnosis not present

## 2012-11-09 DIAGNOSIS — M5412 Radiculopathy, cervical region: Secondary | ICD-10-CM | POA: Diagnosis not present

## 2012-11-13 ENCOUNTER — Encounter (HOSPITAL_COMMUNITY): Payer: Self-pay | Admitting: Emergency Medicine

## 2012-11-13 ENCOUNTER — Emergency Department (INDEPENDENT_AMBULATORY_CARE_PROVIDER_SITE_OTHER)
Admission: EM | Admit: 2012-11-13 | Discharge: 2012-11-13 | Disposition: A | Discharge status: Home / Self Care | Payer: Medicare Other | Source: Home / Self Care | Attending: Emergency Medicine | Admitting: Emergency Medicine

## 2012-11-13 ENCOUNTER — Emergency Department (INDEPENDENT_AMBULATORY_CARE_PROVIDER_SITE_OTHER): Payer: Medicare Other

## 2012-11-13 DIAGNOSIS — J069 Acute upper respiratory infection, unspecified: Secondary | ICD-10-CM | POA: Diagnosis not present

## 2012-11-13 DIAGNOSIS — R0989 Other specified symptoms and signs involving the circulatory and respiratory systems: Secondary | ICD-10-CM | POA: Diagnosis not present

## 2012-11-13 DIAGNOSIS — J209 Acute bronchitis, unspecified: Secondary | ICD-10-CM

## 2012-11-13 DIAGNOSIS — R05 Cough: Secondary | ICD-10-CM | POA: Diagnosis not present

## 2012-11-13 MED ORDER — ALBUTEROL SULFATE HFA 108 (90 BASE) MCG/ACT IN AERS: 1.0000 | INHALATION_SPRAY | Freq: Four times a day (QID) | RESPIRATORY_TRACT | Status: DC | PRN

## 2012-11-13 MED ORDER — CEPHALEXIN 500 MG PO CAPS: 500.0000 mg | ORAL_CAPSULE | Freq: Three times a day (TID) | ORAL | Status: DC

## 2012-11-13 MED ORDER — BENZONATATE 200 MG PO CAPS: 200.0000 mg | ORAL_CAPSULE | Freq: Three times a day (TID) | ORAL | Status: DC | PRN

## 2012-11-13 MED ORDER — FEXOFENADINE-PSEUDOEPHED ER 60-120 MG PO TB12: 1.0000 | ORAL_TABLET | Freq: Two times a day (BID) | ORAL | Status: DC

## 2012-11-13 NOTE — ED Notes (Addendum)
C/o cold sx starting on Friday.  C/o coughing and congestion.  Denies fever and sore throat.  Patient states she is having chest pain from coughing a lot lately.

## 2012-11-13 NOTE — ED Provider Notes (Signed)
Chief Complaint  Patient presents with  . URI    History of Present Illness:   Jenna Foster is a 71 year old female who has had a four-day history of cough productive of yellow-white sputum, right submammary chest pain which radiates through the back and hurts worse when she coughs, nasal congestion with yellow drainage, sinus pressure. She denies any fever, chills, sore throat, wheezing, chest tightness, or GI symptoms. She is allergic to Bactrim. She has diabetes, hypertension, and rheumatoid arthritis and is on multiple medications.  Review of Systems:  Other than noted above, the patient denies any of the following symptoms. Systemic:  No fever, chills, sweats, fatigue, myalgias, headache, or anorexia. Eye:  No redness, pain or drainage. ENT:  No earache, ear congestion, nasal congestion, sneezing, rhinorrhea, sinus pressure, sinus pain, post nasal drip, or sore throat. Lungs:  No cough, sputum production, wheezing, shortness of breath, or chest pain. GI:  No abdominal pain, nausea, vomiting, or diarrhea.  PMFSH:  Past medical history, family history, social history, meds, and allergies were reviewed.  Physical Exam:   Vital signs:  BP 137/72  Pulse 81  Temp 98.4 F (36.9 C) (Oral)  Resp 18  SpO2 99% General:  Alert, in no distress. Eye:  No conjunctival injection or drainage. Lids were normal. ENT:  TMs and canals were normal, without erythema or inflammation.  Nasal mucosa was clear and uncongested, without drainage.  Mucous membranes were moist.  Pharynx was clear, without exudate or drainage.  There were no oral ulcerations or lesions. Neck:  Supple, no adenopathy, tenderness or mass. Lungs:  No respiratory distress.  Lungs were clear to auscultation, without wheezes, rales or rhonchi.  Breath sounds were clear and equal bilaterally.  Heart:  Regular rhythm, without gallops, murmers or rubs. Skin:  Clear, warm, and dry, without rash or lesions.  Radiology:  Dg Chest 2  View  11/13/2012  *RADIOLOGY REPORT*  Clinical Data: Chest congestion with cough for 3 days.  CHEST - 2 VIEW  Comparison: 12/04/2010 radiographs.  Findings: The mineralization and alignment are normal.  There is no evidence of acute fracture or dislocation.  Diffuse paraspinal osteophytes are again noted throughout the thoracic spine.  IMPRESSION: Stable examination.  No active cardiopulmonary process.   Original Report Authenticated By: Carey Bullocks, M.D.    I reviewed the images independently and personally and concur with the radiologist's findings.  Assessment:  The primary encounter diagnosis was Viral upper respiratory infection. A diagnosis of Acute bronchitis was also pertinent to this visit.  Plan:   1.  The following meds were prescribed:   New Prescriptions   ALBUTEROL (PROVENTIL HFA;VENTOLIN HFA) 108 (90 BASE) MCG/ACT INHALER    Inhale 1-2 puffs into the lungs every 6 (six) hours as needed for wheezing.   BENZONATATE (TESSALON) 200 MG CAPSULE    Take 1 capsule (200 mg total) by mouth 3 (three) times daily as needed for cough.   CEPHALEXIN (KEFLEX) 500 MG CAPSULE    Take 1 capsule (500 mg total) by mouth 3 (three) times daily.   FEXOFENADINE-PSEUDOEPHEDRINE (ALLEGRA-D) 60-120 MG PER TABLET    Take 1 tablet by mouth every 12 (twelve) hours.   The patient was told not to get the prescription for antibiotic filled unless her respiratory symptoms had persisted for more than 7 to 10 days.  2.  The patient was instructed in symptomatic care and handouts were given. 3.  The patient was told to return if becoming worse in any way, if no  better in 3 or 4 days, and given some red flag symptoms that would indicate earlier return.   Reuben Likes, MD 11/13/12 [phone]

## 2012-11-14 DIAGNOSIS — M069 Rheumatoid arthritis, unspecified: Secondary | ICD-10-CM | POA: Diagnosis not present

## 2012-12-06 DIAGNOSIS — R490 Dysphonia: Secondary | ICD-10-CM | POA: Diagnosis not present

## 2012-12-06 DIAGNOSIS — J387 Other diseases of larynx: Secondary | ICD-10-CM | POA: Diagnosis not present

## 2012-12-06 DIAGNOSIS — K219 Gastro-esophageal reflux disease without esophagitis: Secondary | ICD-10-CM | POA: Diagnosis not present

## 2012-12-11 DIAGNOSIS — M069 Rheumatoid arthritis, unspecified: Secondary | ICD-10-CM | POA: Diagnosis not present

## 2012-12-25 DIAGNOSIS — I739 Peripheral vascular disease, unspecified: Secondary | ICD-10-CM | POA: Diagnosis not present

## 2012-12-25 DIAGNOSIS — L608 Other nail disorders: Secondary | ICD-10-CM | POA: Diagnosis not present

## 2012-12-25 DIAGNOSIS — E1159 Type 2 diabetes mellitus with other circulatory complications: Secondary | ICD-10-CM | POA: Diagnosis not present

## 2013-01-05 DIAGNOSIS — E1149 Type 2 diabetes mellitus with other diabetic neurological complication: Secondary | ICD-10-CM | POA: Diagnosis not present

## 2013-01-05 DIAGNOSIS — K219 Gastro-esophageal reflux disease without esophagitis: Secondary | ICD-10-CM | POA: Diagnosis not present

## 2013-01-05 DIAGNOSIS — I10 Essential (primary) hypertension: Secondary | ICD-10-CM | POA: Diagnosis not present

## 2013-01-05 DIAGNOSIS — E1159 Type 2 diabetes mellitus with other circulatory complications: Secondary | ICD-10-CM | POA: Diagnosis not present

## 2013-01-29 DIAGNOSIS — M069 Rheumatoid arthritis, unspecified: Secondary | ICD-10-CM | POA: Diagnosis not present

## 2013-02-07 DIAGNOSIS — R490 Dysphonia: Secondary | ICD-10-CM | POA: Diagnosis not present

## 2013-02-07 DIAGNOSIS — J387 Other diseases of larynx: Secondary | ICD-10-CM | POA: Diagnosis not present

## 2013-03-09 DIAGNOSIS — E041 Nontoxic single thyroid nodule: Secondary | ICD-10-CM | POA: Diagnosis not present

## 2013-03-09 DIAGNOSIS — E039 Hypothyroidism, unspecified: Secondary | ICD-10-CM | POA: Diagnosis not present

## 2013-03-20 DIAGNOSIS — M069 Rheumatoid arthritis, unspecified: Secondary | ICD-10-CM | POA: Diagnosis not present

## 2013-03-27 ENCOUNTER — Other Ambulatory Visit: Payer: Self-pay

## 2013-03-27 DIAGNOSIS — Z1231 Encounter for screening mammogram for malignant neoplasm of breast: Secondary | ICD-10-CM

## 2013-03-28 DIAGNOSIS — M069 Rheumatoid arthritis, unspecified: Secondary | ICD-10-CM | POA: Diagnosis not present

## 2013-04-20 DIAGNOSIS — Z1331 Encounter for screening for depression: Secondary | ICD-10-CM | POA: Diagnosis not present

## 2013-04-20 DIAGNOSIS — I739 Peripheral vascular disease, unspecified: Secondary | ICD-10-CM | POA: Diagnosis not present

## 2013-04-20 DIAGNOSIS — Z6838 Body mass index (BMI) 38.0-38.9, adult: Secondary | ICD-10-CM | POA: Diagnosis not present

## 2013-04-20 DIAGNOSIS — E1149 Type 2 diabetes mellitus with other diabetic neurological complication: Secondary | ICD-10-CM | POA: Diagnosis not present

## 2013-04-20 DIAGNOSIS — K219 Gastro-esophageal reflux disease without esophagitis: Secondary | ICD-10-CM | POA: Diagnosis not present

## 2013-04-20 DIAGNOSIS — I10 Essential (primary) hypertension: Secondary | ICD-10-CM | POA: Diagnosis not present

## 2013-04-26 DIAGNOSIS — L608 Other nail disorders: Secondary | ICD-10-CM | POA: Diagnosis not present

## 2013-04-26 DIAGNOSIS — I739 Peripheral vascular disease, unspecified: Secondary | ICD-10-CM | POA: Diagnosis not present

## 2013-04-26 DIAGNOSIS — E1159 Type 2 diabetes mellitus with other circulatory complications: Secondary | ICD-10-CM | POA: Diagnosis not present

## 2013-05-07 ENCOUNTER — Ambulatory Visit: Payer: Medicare Other

## 2013-05-07 ENCOUNTER — Ambulatory Visit
Admission: RE | Admit: 2013-05-07 | Discharge: 2013-05-07 | Disposition: A | Discharge status: Home / Self Care | Payer: Medicare Other | Source: Ambulatory Visit

## 2013-05-07 DIAGNOSIS — Z1231 Encounter for screening mammogram for malignant neoplasm of breast: Secondary | ICD-10-CM

## 2013-05-07 DIAGNOSIS — M069 Rheumatoid arthritis, unspecified: Secondary | ICD-10-CM | POA: Diagnosis not present

## 2013-05-08 DIAGNOSIS — M069 Rheumatoid arthritis, unspecified: Secondary | ICD-10-CM | POA: Diagnosis not present

## 2013-06-06 DIAGNOSIS — IMO0002 Reserved for concepts with insufficient information to code with codable children: Secondary | ICD-10-CM | POA: Diagnosis not present

## 2013-06-06 DIAGNOSIS — G5610 Other lesions of median nerve, unspecified upper limb: Secondary | ICD-10-CM | POA: Diagnosis not present

## 2013-06-06 DIAGNOSIS — G3184 Mild cognitive impairment, so stated: Secondary | ICD-10-CM | POA: Diagnosis not present

## 2013-06-06 DIAGNOSIS — G609 Hereditary and idiopathic neuropathy, unspecified: Secondary | ICD-10-CM | POA: Diagnosis not present

## 2013-06-06 DIAGNOSIS — M5412 Radiculopathy, cervical region: Secondary | ICD-10-CM | POA: Diagnosis not present

## 2013-06-26 DIAGNOSIS — M069 Rheumatoid arthritis, unspecified: Secondary | ICD-10-CM | POA: Diagnosis not present

## 2013-06-27 DIAGNOSIS — R7989 Other specified abnormal findings of blood chemistry: Secondary | ICD-10-CM | POA: Diagnosis not present

## 2013-06-27 DIAGNOSIS — M069 Rheumatoid arthritis, unspecified: Secondary | ICD-10-CM | POA: Diagnosis not present

## 2013-07-02 DIAGNOSIS — E119 Type 2 diabetes mellitus without complications: Secondary | ICD-10-CM | POA: Diagnosis not present

## 2013-07-11 DIAGNOSIS — E1159 Type 2 diabetes mellitus with other circulatory complications: Secondary | ICD-10-CM | POA: Diagnosis not present

## 2013-07-11 DIAGNOSIS — I739 Peripheral vascular disease, unspecified: Secondary | ICD-10-CM | POA: Diagnosis not present

## 2013-07-11 DIAGNOSIS — L608 Other nail disorders: Secondary | ICD-10-CM | POA: Diagnosis not present

## 2013-08-08 DIAGNOSIS — E1149 Type 2 diabetes mellitus with other diabetic neurological complication: Secondary | ICD-10-CM | POA: Diagnosis not present

## 2013-08-08 DIAGNOSIS — I10 Essential (primary) hypertension: Secondary | ICD-10-CM | POA: Diagnosis not present

## 2013-08-08 DIAGNOSIS — Z6836 Body mass index (BMI) 36.0-36.9, adult: Secondary | ICD-10-CM | POA: Diagnosis not present

## 2013-08-08 DIAGNOSIS — M069 Rheumatoid arthritis, unspecified: Secondary | ICD-10-CM | POA: Diagnosis not present

## 2013-08-08 DIAGNOSIS — I739 Peripheral vascular disease, unspecified: Secondary | ICD-10-CM | POA: Diagnosis not present

## 2013-08-08 DIAGNOSIS — E1159 Type 2 diabetes mellitus with other circulatory complications: Secondary | ICD-10-CM | POA: Diagnosis not present

## 2013-08-08 DIAGNOSIS — N644 Mastodynia: Secondary | ICD-10-CM | POA: Diagnosis not present

## 2013-08-14 DIAGNOSIS — M069 Rheumatoid arthritis, unspecified: Secondary | ICD-10-CM | POA: Diagnosis not present

## 2013-08-19 ENCOUNTER — Emergency Department (HOSPITAL_COMMUNITY)
Admission: EM | Admit: 2013-08-19 | Discharge: 2013-08-19 | Disposition: A | Discharge status: Home / Self Care | Payer: Medicare Other | Attending: Emergency Medicine | Admitting: Emergency Medicine

## 2013-08-19 ENCOUNTER — Encounter (HOSPITAL_COMMUNITY): Payer: Self-pay | Admitting: Emergency Medicine

## 2013-08-19 ENCOUNTER — Emergency Department (HOSPITAL_COMMUNITY): Payer: Medicare Other

## 2013-08-19 DIAGNOSIS — I10 Essential (primary) hypertension: Secondary | ICD-10-CM | POA: Diagnosis not present

## 2013-08-19 DIAGNOSIS — Z79899 Other long term (current) drug therapy: Secondary | ICD-10-CM | POA: Diagnosis not present

## 2013-08-19 DIAGNOSIS — Z881 Allergy status to other antibiotic agents status: Secondary | ICD-10-CM | POA: Insufficient documentation

## 2013-08-19 DIAGNOSIS — M545 Low back pain, unspecified: Secondary | ICD-10-CM | POA: Insufficient documentation

## 2013-08-19 DIAGNOSIS — M47817 Spondylosis without myelopathy or radiculopathy, lumbosacral region: Secondary | ICD-10-CM | POA: Diagnosis not present

## 2013-08-19 DIAGNOSIS — M1990 Unspecified osteoarthritis, unspecified site: Secondary | ICD-10-CM | POA: Insufficient documentation

## 2013-08-19 DIAGNOSIS — E119 Type 2 diabetes mellitus without complications: Secondary | ICD-10-CM | POA: Diagnosis not present

## 2013-08-19 DIAGNOSIS — Z7982 Long term (current) use of aspirin: Secondary | ICD-10-CM | POA: Insufficient documentation

## 2013-08-19 DIAGNOSIS — M19079 Primary osteoarthritis, unspecified ankle and foot: Secondary | ICD-10-CM | POA: Diagnosis not present

## 2013-08-19 DIAGNOSIS — J45909 Unspecified asthma, uncomplicated: Secondary | ICD-10-CM | POA: Diagnosis not present

## 2013-08-19 DIAGNOSIS — M549 Dorsalgia, unspecified: Secondary | ICD-10-CM

## 2013-08-19 LAB — URINALYSIS W MICROSCOPIC + REFLEX CULTURE
Bilirubin Urine: NEGATIVE
Glucose, UA: NEGATIVE mg/dL
Hgb urine dipstick: NEGATIVE
Specific Gravity, Urine: 1.006 (ref 1.005–1.030)

## 2013-08-19 MED ORDER — METHOCARBAMOL 500 MG PO TABS
500.0000 mg | ORAL_TABLET | Freq: Once | ORAL | Status: AC
  Administered 2013-08-19: 500 mg via ORAL
  Filled 2013-08-19: qty 1

## 2013-08-19 MED ORDER — BACLOFEN 10 MG PO TABS: 10.0000 mg | ORAL_TABLET | Freq: Three times a day (TID) | ORAL | Status: DC

## 2013-08-19 MED ORDER — HYDROCODONE-ACETAMINOPHEN 5-325 MG PO TABS: 1.0000 | ORAL_TABLET | Freq: Four times a day (QID) | ORAL | Status: DC | PRN

## 2013-08-19 MED ORDER — KETOROLAC TROMETHAMINE 60 MG/2ML IM SOLN
30.0000 mg | Freq: Once | INTRAMUSCULAR | Status: AC
  Administered 2013-08-19: 30 mg via INTRAMUSCULAR
  Filled 2013-08-19: qty 2

## 2013-08-19 MED ORDER — OXYCODONE-ACETAMINOPHEN 5-325 MG PO TABS
1.0000 | ORAL_TABLET | Freq: Once | ORAL | Status: AC
  Administered 2013-08-19: 1 via ORAL
  Filled 2013-08-19: qty 1

## 2013-08-19 NOTE — ED Notes (Signed)
Pt discharged.Vital signs stable and GCS 15 

## 2013-08-19 NOTE — ED Notes (Signed)
Notified RN of B/p 111/40

## 2013-08-19 NOTE — ED Notes (Signed)
Pt c/o right low back pain onset yesterday. Pt also reports left leg pain onset yesterday. Pt reports that she was is walking or moving something catches at her right low back. Pt denies injury or urinary symptoms.

## 2013-08-19 NOTE — ED Provider Notes (Signed)
CSN: [id number]     Arrival date & time 08/19/13  0847 History   First MD Initiated Contact with Patient 08/19/13 [phone]     Chief Complaint  Patient presents with  . Back Pain   (Consider location/radiation/quality/duration/timing/severity/associated sxs/prior Treatment) HPI Jenna Foster is a 72 y.o. female who presents emergency department with complaint of right lower back pain. Patient states that she has had mild pain in that area for "some time." Patient states that yesterday her pain got worse. Patient states she does have history of arthritis and usually able to rub something on the sore area and the pain goes away. States since yesterday she has been rubbing stuff on it but it has not helped. Patient states pain is worsened with movement and walking. Patient states the pain does not radiate. She denies any fever, chills, urinary symptoms, abdominal pain. She denies any numbness or weakness in extremities. She is taking ibuprofen for this with no relief. She denies any loss of bowels or urinary incontinence or retention. Past Medical History  Diagnosis Date  . Diabetes mellitus   . Hypertension   . Asthma    Past Surgical History  Procedure Laterality Date  . Cholecystectomy    . Abdominal hysterectomy     Family History  Problem Relation Age of Onset  . Diabetes Mother   . Hypertension Mother    History  Substance Use Topics  . Smoking status: Never Smoker   . Smokeless tobacco: Not on file  . Alcohol Use: No   OB History   Grav Para Term Preterm Abortions TAB SAB Ect Mult Living                 Review of Systems  Constitutional: Negative for fever and chills.  HENT: Negative for neck pain and neck stiffness.   Respiratory: Negative for cough, chest tightness and shortness of breath.   Cardiovascular: Negative for chest pain, palpitations and leg swelling.  Gastrointestinal: Negative for nausea, vomiting, abdominal pain and diarrhea.  Genitourinary: Negative for  dysuria, flank pain, vaginal bleeding, vaginal discharge, vaginal pain and pelvic pain.  Musculoskeletal: Positive for back pain. Negative for myalgias and arthralgias.  Skin: Negative for rash.  Neurological: Negative for dizziness, weakness, numbness and headaches.  All other systems reviewed and are negative.    Allergies  Bactrim  Home Medications   Current Outpatient Rx  Name  Route  Sig  Dispense  Refill  . albuterol (PROVENTIL HFA;VENTOLIN HFA) 108 (90 BASE) MCG/ACT inhaler   Inhalation   Inhale 1-2 puffs into the lungs every 6 (six) hours as needed for wheezing.   1 Inhaler   0   . aspirin 81 MG EC tablet   Oral   Take 81 mg by mouth daily.           . benzonatate (TESSALON) 200 MG capsule   Oral   Take 1 capsule (200 mg total) by mouth 3 (three) times daily as needed for cough.   30 capsule   0   . cephALEXin (KEFLEX) 500 MG capsule   Oral   Take 1 capsule (500 mg total) by mouth 3 (three) times daily.   30 capsule   0   . Cyanocobalamin (VITAMIN B-12 CR PO)   Oral   Take by mouth.         . diclofenac (CATAFLAM) 50 MG tablet   Oral   Take 50 mg by mouth 3 (three) times daily.           Marland Kitchen  fexofenadine-pseudoephedrine (ALLEGRA-D) 60-120 MG per tablet   Oral   Take 1 tablet by mouth every 12 (twelve) hours.   30 tablet   0   . Folic Acid-B2-B6-B12-C-Choline (FOLIC ACID XTRA) TABS                . furosemide (LASIX) 20 MG tablet   Oral   Take 20 mg by mouth daily.         Marland Kitchen GABAPENTIN, PHN, PO   Oral   Take by mouth.           . InFLIXimab (REMICADE IV)   Intravenous   Inject into the vein. Every seven weeks         . inFLIXimab (REMICADE) 100 MG injection   Intravenous   Inject into the vein.         . Levothyroxine Sodium 50 MCG CAPS   Oral   Take by mouth.           . losartan-hydrochlorothiazide (HYZAAR) 100-12.5 MG per tablet   Oral   Take 1 tablet by mouth daily.           . metFORMIN (GLUCOPHAGE) 500 MG  tablet   Oral   Take 500 mg by mouth daily.           . methotrexate (RHEUMATREX) 2.5 MG tablet   Oral   Take 20 mg by mouth once a week. Caution:Chemotherapy. Protect from light.          . metoprolol tartrate (LOPRESSOR) 25 MG tablet   Oral   Take 25 mg by mouth 2 (two) times daily.         . Multiple Vitamins-Minerals (CENTRUM SILVER PO)   Oral   Take 1 tablet by mouth daily.           . nortriptyline (PAMELOR) 25 MG capsule   Oral   Take 25 mg by mouth 2 (two) times daily.         Marland Kitchen omeprazole (PRILOSEC) 20 MG capsule   Oral   Take 20 mg by mouth 2 (two) times daily.           . Polyethylene Glycol 1450 LIQD      as needed. for constipation          . rosuvastatin (CRESTOR) 5 MG tablet   Oral   Take 5 mg by mouth. Half a tablet daily          BP 111/48  Pulse 74  Temp(Src) 97.9 F (36.6 C) (Oral)  Resp 18  Ht 5' 5.5" (1.664 m)  Wt 220 lb (99.791 kg)  BMI 36.04 kg/m2  SpO2 99% Physical Exam  Nursing note and vitals reviewed. Constitutional: She appears well-developed and well-nourished. No distress.  HENT:  Head: Normocephalic.  Eyes: Conjunctivae are normal.  Neck: Neck supple.  Cardiovascular: Normal rate, regular rhythm and normal heart sounds.   Pulmonary/Chest: Effort normal and breath sounds normal. No respiratory distress. She has no wheezes. She has no rales.  Abdominal: Soft. Bowel sounds are normal. She exhibits no distension. There is no tenderness. There is no rebound.  Musculoskeletal: She exhibits no edema.  No midline thoracic or lumbar spine tenderness. Right paravertebral and right SI joint tenderness. Pain with straight right leg raise. No pain with and right hip flexion, no pain with right hip internal and external rotation. Dorsal pedal pulses are intact and equal bilaterally. There is mild swelling over bilateral ankles and feet with 1+ edema.  Neurological: She is alert.  5/5 and equal lower extremity strength. 2+ and  equal patellar reflexes bilaterally. Pt able to dorsiflex bilateral toes and feet with good strength against resistance. Equal sensation bilaterally over thighs and lower legs.   Skin: Skin is warm and dry.  Psychiatric: She has a normal mood and affect. Her behavior is normal.    ED Course  Procedures (including critical care time) Labs Review Labs Reviewed  URINALYSIS W MICROSCOPIC + REFLEX CULTURE - Abnormal; Notable for the following:    Leukocytes, UA SMALL (*)    Squamous Epithelial / LPF FEW (*)    All other components within normal limits   Imaging Review Dg Lumbar Spine Complete  08/19/2013   *RADIOLOGY REPORT*  Clinical Data: Right sided lower back pain for several months  LUMBAR SPINE - COMPLETE 4+ VIEW  Comparison: None.  Findings: The vertebral body heights are normal.  There is no malalignment.  There are facet joint sclerosis throughout the lumbar spine.  Anterior osteophytosis is noted in the upper lumbar spine and lower lumbar spine.  There is no acute fracture or dislocation.  Bowel content is noted throughout colon.  IMPRESSION: Degenerative joint changes of lumbar spine.  No acute fracture or dislocation is identified.   Original Report Authenticated By: Sherian Rein, M.D.    MDM   1. Back pain   2. Degenerative joint disease     Patient with history of arthritis on Remicade here with increased back pain. Pain is localized and reproducible mainly in her right SI joint. Positive right straight leg raise. No signs of cauda equina. No numbness or weakness in extremities. Small bowel tenderness. No fever. Pain improved with 8 Percocet, Toradol, Robaxin by mouth in emergency department. She was able to ambulate in the hallway. Her urine is normal. Vital signs are normal. She'll be discharged home with close followup with primary care Dr.  Ceasar Mons Vitals:   08/19/13 0855 08/19/13 0900 08/19/13 1131 08/19/13 1239  BP: 111/48 115/43 111/40 111/49  Pulse: 74 74 69 74  Temp:  97.9 F (36.6 C)     TempSrc: Oral     Resp: 18  16 18   Height: 5' 5.5" (1.664 m)     Weight: 220 lb (99.791 kg)     SpO2: 99% 100% 95% 97%       Lottie Mussel, PA-C 08/19/13 1604

## 2013-08-19 NOTE — ED Notes (Signed)
Pt ambulated but complained of pain.

## 2013-08-19 NOTE — ED Provider Notes (Signed)
Medical screening examination/treatment/procedure(s) were conducted as a shared visit with non-physician practitioner(s) and myself.  I personally evaluated the patient during the encounter.   Pt is a 72 y.o. female with Pmhx as above who presents with localized, reproducible pain over SI joint.  No numbness, weakness, fever, n/v, d/a.  XR with DDD of lumbar spine.   1. Back pain   2. Degenerative joint disease        Shanna Cisco, MD 08/19/13 [phone]

## 2013-08-20 ENCOUNTER — Other Ambulatory Visit: Payer: Self-pay | Admitting: Rheumatology

## 2013-08-20 DIAGNOSIS — R7989 Other specified abnormal findings of blood chemistry: Secondary | ICD-10-CM

## 2013-08-21 ENCOUNTER — Ambulatory Visit
Admission: RE | Admit: 2013-08-21 | Discharge: 2013-08-21 | Disposition: A | Discharge status: Home / Self Care | Payer: Medicare Other | Source: Ambulatory Visit | Attending: Rheumatology | Admitting: Rheumatology

## 2013-08-21 DIAGNOSIS — K7689 Other specified diseases of liver: Secondary | ICD-10-CM | POA: Diagnosis not present

## 2013-08-21 DIAGNOSIS — R7989 Other specified abnormal findings of blood chemistry: Secondary | ICD-10-CM

## 2013-08-23 DIAGNOSIS — I10 Essential (primary) hypertension: Secondary | ICD-10-CM | POA: Diagnosis not present

## 2013-08-23 DIAGNOSIS — Z6835 Body mass index (BMI) 35.0-35.9, adult: Secondary | ICD-10-CM | POA: Diagnosis not present

## 2013-08-23 DIAGNOSIS — E1149 Type 2 diabetes mellitus with other diabetic neurological complication: Secondary | ICD-10-CM | POA: Diagnosis not present

## 2013-08-23 DIAGNOSIS — M545 Low back pain: Secondary | ICD-10-CM | POA: Diagnosis not present

## 2013-08-30 DIAGNOSIS — Z23 Encounter for immunization: Secondary | ICD-10-CM | POA: Diagnosis not present

## 2013-09-03 ENCOUNTER — Other Ambulatory Visit: Payer: Self-pay | Admitting: Physical Medicine and Rehabilitation

## 2013-09-03 DIAGNOSIS — M549 Dorsalgia, unspecified: Secondary | ICD-10-CM

## 2013-09-03 DIAGNOSIS — M5410 Radiculopathy, site unspecified: Secondary | ICD-10-CM

## 2013-09-03 DIAGNOSIS — M47817 Spondylosis without myelopathy or radiculopathy, lumbosacral region: Secondary | ICD-10-CM | POA: Diagnosis not present

## 2013-09-03 DIAGNOSIS — IMO0002 Reserved for concepts with insufficient information to code with codable children: Secondary | ICD-10-CM | POA: Diagnosis not present

## 2013-09-05 ENCOUNTER — Ambulatory Visit
Admission: RE | Admit: 2013-09-05 | Discharge: 2013-09-05 | Disposition: A | Discharge status: Home / Self Care | Payer: Medicare Other | Source: Ambulatory Visit | Attending: Physical Medicine and Rehabilitation | Admitting: Physical Medicine and Rehabilitation

## 2013-09-05 DIAGNOSIS — M47817 Spondylosis without myelopathy or radiculopathy, lumbosacral region: Secondary | ICD-10-CM | POA: Diagnosis not present

## 2013-09-05 DIAGNOSIS — M549 Dorsalgia, unspecified: Secondary | ICD-10-CM

## 2013-09-05 DIAGNOSIS — M5410 Radiculopathy, site unspecified: Secondary | ICD-10-CM

## 2013-09-10 DIAGNOSIS — E039 Hypothyroidism, unspecified: Secondary | ICD-10-CM | POA: Diagnosis not present

## 2013-09-10 DIAGNOSIS — E041 Nontoxic single thyroid nodule: Secondary | ICD-10-CM | POA: Diagnosis not present

## 2013-09-13 DIAGNOSIS — M47817 Spondylosis without myelopathy or radiculopathy, lumbosacral region: Secondary | ICD-10-CM | POA: Diagnosis not present

## 2013-09-13 DIAGNOSIS — M545 Low back pain: Secondary | ICD-10-CM | POA: Diagnosis not present

## 2013-09-13 DIAGNOSIS — M4310 Spondylolisthesis, site unspecified: Secondary | ICD-10-CM | POA: Diagnosis not present

## 2013-09-24 DIAGNOSIS — M545 Low back pain: Secondary | ICD-10-CM | POA: Diagnosis not present

## 2013-09-26 DIAGNOSIS — M545 Low back pain: Secondary | ICD-10-CM | POA: Diagnosis not present

## 2013-09-28 DIAGNOSIS — E1159 Type 2 diabetes mellitus with other circulatory complications: Secondary | ICD-10-CM | POA: Diagnosis not present

## 2013-09-28 DIAGNOSIS — I739 Peripheral vascular disease, unspecified: Secondary | ICD-10-CM | POA: Diagnosis not present

## 2013-09-28 DIAGNOSIS — L608 Other nail disorders: Secondary | ICD-10-CM | POA: Diagnosis not present

## 2013-10-02 DIAGNOSIS — M069 Rheumatoid arthritis, unspecified: Secondary | ICD-10-CM | POA: Diagnosis not present

## 2013-10-03 DIAGNOSIS — M545 Low back pain: Secondary | ICD-10-CM | POA: Diagnosis not present

## 2013-10-05 DIAGNOSIS — M545 Low back pain: Secondary | ICD-10-CM | POA: Diagnosis not present

## 2013-10-09 DIAGNOSIS — M545 Low back pain: Secondary | ICD-10-CM | POA: Diagnosis not present

## 2013-10-10 DIAGNOSIS — M545 Low back pain: Secondary | ICD-10-CM | POA: Diagnosis not present

## 2013-10-11 DIAGNOSIS — M545 Low back pain: Secondary | ICD-10-CM | POA: Diagnosis not present

## 2013-10-11 DIAGNOSIS — M47817 Spondylosis without myelopathy or radiculopathy, lumbosacral region: Secondary | ICD-10-CM | POA: Diagnosis not present

## 2013-10-23 DIAGNOSIS — M069 Rheumatoid arthritis, unspecified: Secondary | ICD-10-CM | POA: Diagnosis not present

## 2013-10-23 DIAGNOSIS — R7989 Other specified abnormal findings of blood chemistry: Secondary | ICD-10-CM | POA: Diagnosis not present

## 2013-11-20 DIAGNOSIS — M069 Rheumatoid arthritis, unspecified: Secondary | ICD-10-CM | POA: Diagnosis not present

## 2013-11-30 DIAGNOSIS — R82998 Other abnormal findings in urine: Secondary | ICD-10-CM | POA: Diagnosis not present

## 2013-11-30 DIAGNOSIS — M109 Gout, unspecified: Secondary | ICD-10-CM | POA: Diagnosis not present

## 2013-11-30 DIAGNOSIS — I10 Essential (primary) hypertension: Secondary | ICD-10-CM | POA: Diagnosis not present

## 2013-11-30 DIAGNOSIS — R809 Proteinuria, unspecified: Secondary | ICD-10-CM | POA: Diagnosis not present

## 2013-11-30 DIAGNOSIS — E1149 Type 2 diabetes mellitus with other diabetic neurological complication: Secondary | ICD-10-CM | POA: Diagnosis not present

## 2013-11-30 DIAGNOSIS — E782 Mixed hyperlipidemia: Secondary | ICD-10-CM | POA: Diagnosis not present

## 2013-12-10 DIAGNOSIS — L608 Other nail disorders: Secondary | ICD-10-CM | POA: Diagnosis not present

## 2013-12-10 DIAGNOSIS — E1142 Type 2 diabetes mellitus with diabetic polyneuropathy: Secondary | ICD-10-CM | POA: Diagnosis not present

## 2013-12-10 DIAGNOSIS — E1159 Type 2 diabetes mellitus with other circulatory complications: Secondary | ICD-10-CM | POA: Diagnosis not present

## 2013-12-10 DIAGNOSIS — I739 Peripheral vascular disease, unspecified: Secondary | ICD-10-CM | POA: Diagnosis not present

## 2013-12-14 DIAGNOSIS — M069 Rheumatoid arthritis, unspecified: Secondary | ICD-10-CM | POA: Diagnosis not present

## 2013-12-14 DIAGNOSIS — M109 Gout, unspecified: Secondary | ICD-10-CM | POA: Diagnosis not present

## 2013-12-14 DIAGNOSIS — E1149 Type 2 diabetes mellitus with other diabetic neurological complication: Secondary | ICD-10-CM | POA: Diagnosis not present

## 2013-12-14 DIAGNOSIS — Z23 Encounter for immunization: Secondary | ICD-10-CM | POA: Diagnosis not present

## 2013-12-14 DIAGNOSIS — R609 Edema, unspecified: Secondary | ICD-10-CM | POA: Diagnosis not present

## 2013-12-14 DIAGNOSIS — I739 Peripheral vascular disease, unspecified: Secondary | ICD-10-CM | POA: Diagnosis not present

## 2013-12-14 DIAGNOSIS — Z0000 Encounter for general adult medical examination without abnormal findings: Secondary | ICD-10-CM | POA: Diagnosis not present

## 2013-12-14 DIAGNOSIS — G4733 Obstructive sleep apnea (adult) (pediatric): Secondary | ICD-10-CM | POA: Diagnosis not present

## 2013-12-14 DIAGNOSIS — Z79899 Other long term (current) drug therapy: Secondary | ICD-10-CM | POA: Diagnosis not present

## 2013-12-18 DIAGNOSIS — Z1212 Encounter for screening for malignant neoplasm of rectum: Secondary | ICD-10-CM | POA: Diagnosis not present

## 2014-01-08 DIAGNOSIS — M069 Rheumatoid arthritis, unspecified: Secondary | ICD-10-CM | POA: Diagnosis not present

## 2014-01-09 DIAGNOSIS — M5412 Radiculopathy, cervical region: Secondary | ICD-10-CM | POA: Diagnosis not present

## 2014-01-09 DIAGNOSIS — G5610 Other lesions of median nerve, unspecified upper limb: Secondary | ICD-10-CM | POA: Diagnosis not present

## 2014-01-09 DIAGNOSIS — G609 Hereditary and idiopathic neuropathy, unspecified: Secondary | ICD-10-CM | POA: Diagnosis not present

## 2014-01-09 DIAGNOSIS — IMO0002 Reserved for concepts with insufficient information to code with codable children: Secondary | ICD-10-CM | POA: Diagnosis not present

## 2014-01-09 DIAGNOSIS — G3184 Mild cognitive impairment, so stated: Secondary | ICD-10-CM | POA: Diagnosis not present

## 2014-01-16 DIAGNOSIS — M545 Low back pain, unspecified: Secondary | ICD-10-CM | POA: Diagnosis not present

## 2014-01-16 DIAGNOSIS — M47817 Spondylosis without myelopathy or radiculopathy, lumbosacral region: Secondary | ICD-10-CM | POA: Diagnosis not present

## 2014-01-21 DIAGNOSIS — M545 Low back pain, unspecified: Secondary | ICD-10-CM | POA: Diagnosis not present

## 2014-01-21 DIAGNOSIS — M47817 Spondylosis without myelopathy or radiculopathy, lumbosacral region: Secondary | ICD-10-CM | POA: Diagnosis not present

## 2014-02-24 ENCOUNTER — Emergency Department (INDEPENDENT_AMBULATORY_CARE_PROVIDER_SITE_OTHER)
Admission: EM | Admit: 2014-02-24 | Discharge: 2014-02-24 | Disposition: A | Discharge status: Home / Self Care | Payer: Medicare Other | Source: Home / Self Care | Attending: Family Medicine | Admitting: Family Medicine

## 2014-02-24 ENCOUNTER — Encounter (HOSPITAL_COMMUNITY): Payer: Self-pay | Admitting: Emergency Medicine

## 2014-02-24 DIAGNOSIS — J40 Bronchitis, not specified as acute or chronic: Secondary | ICD-10-CM

## 2014-02-24 MED ORDER — ALBUTEROL SULFATE HFA 108 (90 BASE) MCG/ACT IN AERS: 2.0000 | INHALATION_SPRAY | Freq: Four times a day (QID) | RESPIRATORY_TRACT | Status: DC | PRN

## 2014-02-24 MED ORDER — IPRATROPIUM-ALBUTEROL 0.5-2.5 (3) MG/3ML IN SOLN
3.0000 mL | Freq: Once | RESPIRATORY_TRACT | Status: AC
  Administered 2014-02-24: 3 mL via RESPIRATORY_TRACT

## 2014-02-24 MED ORDER — FLUTICASONE PROPIONATE 50 MCG/ACT NA SUSP: 2.0000 | Freq: Every day | NASAL | Status: DC

## 2014-02-24 MED ORDER — PREDNISONE 10 MG PO TABS: 30.0000 mg | ORAL_TABLET | Freq: Every day | ORAL | Status: DC

## 2014-02-24 MED ORDER — IPRATROPIUM-ALBUTEROL 0.5-2.5 (3) MG/3ML IN SOLN
RESPIRATORY_TRACT | Status: AC
  Filled 2014-02-24: qty 3

## 2014-02-24 NOTE — ED Notes (Signed)
Pt  Reports  She  Woke  Up yesterday  With  The  Symptoms  Of   A  Productive  At  Times  Cough       As well as a  Sensation of  Some  Hoarseness  As  Well

## 2014-02-24 NOTE — ED Provider Notes (Signed)
Jenna Foster is a 73 y.o. female who presents to Urgent Care today for cough. Patient has one day of mild productive cough and hoarse voice. She also notes a sore throat. She denies any fevers chills nausea vomiting diarrhea or shortness of breath. She has tried Mucinex, and DayQuil. She has not tried any allergy medications. She was additionally notes itchy watery eyes and sneezing.   Past Medical History  Diagnosis Date  . Diabetes mellitus   . Hypertension   . Asthma    History  Substance Use Topics  . Smoking status: Never Smoker   . Smokeless tobacco: Not on file  . Alcohol Use: No   ROS as above Medications: No current facility-administered medications for this encounter.   Current Outpatient Prescriptions  Medication Sig Dispense Refill  . albuterol (PROVENTIL HFA;VENTOLIN HFA) 108 (90 BASE) MCG/ACT inhaler Inhale 2 puffs into the lungs every 6 (six) hours as needed for wheezing or shortness of breath.  1 Inhaler  2  . aspirin 81 MG EC tablet Take 81 mg by mouth daily.       . baclofen (LIORESAL) 10 MG tablet Take 1 tablet (10 mg total) by mouth 3 (three) times daily.  20 each  0  . Cyanocobalamin (VITAMIN B-12 CR PO) Take 1 tablet by mouth daily.       . fluticasone (FLONASE) 50 MCG/ACT nasal spray Place 2 sprays into both nostrils daily.  16 g  2  . HYDROcodone-acetaminophen (NORCO) 5-325 MG per tablet Take 1 tablet by mouth every 6 (six) hours as needed for pain.  20 tablet  0  . InFLIXimab (REMICADE IV) Inject 100 mg into the vein every 7 (seven) weeks.       . Levothyroxine Sodium 50 MCG CAPS Take 50 mcg by mouth daily.       Marland Kitchen losartan-hydrochlorothiazide (HYZAAR) 100-12.5 MG per tablet Take 1 tablet by mouth daily.       . metFORMIN (GLUCOPHAGE) 500 MG tablet Take 500 mg by mouth daily.       . metoprolol tartrate (LOPRESSOR) 25 MG tablet Take 50 mg by mouth daily.       . Multiple Vitamins-Minerals (CENTRUM SILVER PO) Take 1 tablet by mouth daily.        .  nortriptyline (PAMELOR) 25 MG capsule Take 50 mg by mouth at bedtime.       Marland Kitchen omeprazole (PRILOSEC) 20 MG capsule Take 20 mg by mouth 2 (two) times daily.        . predniSONE (DELTASONE) 10 MG tablet Take 3 tablets (30 mg total) by mouth daily.  15 tablet  0  . rosuvastatin (CRESTOR) 5 MG tablet Take 2.5 mg by mouth daily.         Exam:  BP 165/59  Pulse 81  Temp(Src) 98.7 F (37.1 C) (Oral)  Resp 16  SpO2 100% Gen: Well NAD HEENT: EOMI,  MMM posterior pharynx with cobblestoning. Mildly inflamed nasal turbinates. Normal tympanic membranes bilaterally. Lungs: Normal work of breathing. CTABL Heart: RRR no MRG Abd: NABS, Soft. NT, ND Exts: Brisk capillary refill, warm and well perfused.   Patient was given a DuoNeb nebulizer treatment, and felt much better  No results found for this or any previous visit (from the past 24 hour(s)). No results found.  Assessment and Plan: 73 y.o. female with bronchitis likely due to seasonal allergies. Plan to use albuterol, prednisone, and Flonase nasal spray as well as over-the-counter Zyrtec. Patient declined codeine-based cough medication. Followup  as needed.  Discussed warning signs or symptoms. Please see discharge instructions. Patient expresses understanding.    Rodolph Bong, MD 02/24/14 [phone]

## 2014-02-24 NOTE — Discharge Instructions (Signed)
Thank you for coming in today. Take prednisone daily for 5 days.  Use flonase nasal spray.  Take over the counter zytrec (certizine) daily.  Use albuterol as needed.  Call or go to the emergency room if you get worse, have trouble breathing, have chest pains, or palpitations.   Bronchitis Bronchitis is inflammation of the airways that extend from the windpipe into the lungs (bronchi). The inflammation often causes mucus to develop, which leads to a cough. If the inflammation becomes severe, it may cause shortness of breath. CAUSES  Bronchitis may be caused by:   Viral infections.   Bacteria.   Cigarette smoke.   Allergens, pollutants, and other irritants.  SIGNS AND SYMPTOMS  The most common symptom of bronchitis is a frequent cough that produces mucus. Other symptoms include:  Fever.   Body aches.   Chest congestion.   Chills.   Shortness of breath.   Sore throat.  DIAGNOSIS  Bronchitis is usually diagnosed through a medical history and physical exam. Tests, such as chest X-rays, are sometimes done to rule out other conditions.  TREATMENT  You may need to avoid contact with whatever caused the problem (smoking, for example). Medicines are sometimes needed. These may include:  Antibiotics. These may be prescribed if the condition is caused by bacteria.  Cough suppressants. These may be prescribed for relief of cough symptoms.   Inhaled medicines. These may be prescribed to help open your airways and make it easier for you to breathe.   Steroid medicines. These may be prescribed for those with recurrent (chronic) bronchitis. HOME CARE INSTRUCTIONS  Get plenty of rest.   Drink enough fluids to keep your urine clear or pale yellow (unless you have a medical condition that requires fluid restriction). Increasing fluids may help thin your secretions and will prevent dehydration.   Only take over-the-counter or prescription medicines as directed by your  health care provider.  Only take antibiotics as directed. Make sure you finish them even if you start to feel better.  Avoid secondhand smoke, irritating chemicals, and strong fumes. These will make bronchitis worse. If you are a smoker, quit smoking. Consider using nicotine gum or skin patches to help control withdrawal symptoms. Quitting smoking will help your lungs heal faster.   Put a cool-mist humidifier in your bedroom at night to moisten the air. This may help loosen mucus. Change the water in the humidifier daily. You can also run the hot water in your shower and sit in the bathroom with the door closed for 5 10 minutes.   Follow up with your health care provider as directed.   Wash your hands frequently to avoid catching bronchitis again or spreading an infection to others.  SEEK MEDICAL CARE IF: Your symptoms do not improve after 1 week of treatment.  SEEK IMMEDIATE MEDICAL CARE IF:  Your fever increases.  You have chills.   You have chest pain.   You have worsening shortness of breath.   You have bloody sputum.  You faint.  You have lightheadedness.  You have a severe headache.   You vomit repeatedly. MAKE SURE YOU:   Understand these instructions.  Will watch your condition.  Will get help right away if you are not doing well or get worse. Document Released: 11/15/2005 Document Revised: 09/05/2013 Document Reviewed: 07/10/2013 [facility] Patient Information 2014 [place], [place].

## 2014-03-05 DIAGNOSIS — M069 Rheumatoid arthritis, unspecified: Secondary | ICD-10-CM | POA: Diagnosis not present

## 2014-03-11 DIAGNOSIS — J209 Acute bronchitis, unspecified: Secondary | ICD-10-CM | POA: Diagnosis not present

## 2014-03-11 DIAGNOSIS — G4733 Obstructive sleep apnea (adult) (pediatric): Secondary | ICD-10-CM | POA: Diagnosis not present

## 2014-03-11 DIAGNOSIS — E1149 Type 2 diabetes mellitus with other diabetic neurological complication: Secondary | ICD-10-CM | POA: Diagnosis not present

## 2014-03-11 DIAGNOSIS — R059 Cough, unspecified: Secondary | ICD-10-CM | POA: Diagnosis not present

## 2014-03-11 DIAGNOSIS — R05 Cough: Secondary | ICD-10-CM | POA: Diagnosis not present

## 2014-03-11 DIAGNOSIS — I10 Essential (primary) hypertension: Secondary | ICD-10-CM | POA: Diagnosis not present

## 2014-03-11 DIAGNOSIS — Z6834 Body mass index (BMI) 34.0-34.9, adult: Secondary | ICD-10-CM | POA: Diagnosis not present

## 2014-03-14 DIAGNOSIS — R6889 Other general symptoms and signs: Secondary | ICD-10-CM | POA: Diagnosis not present

## 2014-03-19 DIAGNOSIS — R7612 Nonspecific reaction to cell mediated immunity measurement of gamma interferon antigen response without active tuberculosis: Secondary | ICD-10-CM | POA: Diagnosis not present

## 2014-03-19 DIAGNOSIS — R7611 Nonspecific reaction to tuberculin skin test without active tuberculosis: Secondary | ICD-10-CM | POA: Diagnosis not present

## 2014-04-02 ENCOUNTER — Encounter: Payer: Self-pay | Admitting: Internal Medicine

## 2014-04-02 ENCOUNTER — Ambulatory Visit (INDEPENDENT_AMBULATORY_CARE_PROVIDER_SITE_OTHER): Payer: Medicare Other | Admitting: Internal Medicine

## 2014-04-02 VITALS — BP 112/70 | HR 70 | Temp 98.2°F | Ht 65.0 in | Wt 211.0 lb

## 2014-04-02 DIAGNOSIS — R05 Cough: Secondary | ICD-10-CM

## 2014-04-02 DIAGNOSIS — R059 Cough, unspecified: Secondary | ICD-10-CM | POA: Diagnosis not present

## 2014-04-02 DIAGNOSIS — R058 Other specified cough: Secondary | ICD-10-CM

## 2014-04-02 MED ORDER — PANTOPRAZOLE SODIUM 40 MG PO TBEC: 40.0000 mg | DELAYED_RELEASE_TABLET | Freq: Every day | ORAL | Status: DC

## 2014-04-02 MED ORDER — PREDNISONE 10 MG PO TABS: ORAL_TABLET | ORAL | Status: DC

## 2014-04-02 NOTE — Patient Instructions (Addendum)
The key to effective treatment for your cough is eliminating the non-stop cycle of cough you're stuck in long enough to let your airway heal completely and then see if there is anything still making you cough once you stop the cough suppression, but this should take no more than 5 days to figure out  First take delsym two tsp every 12 hours if needed   Prednisone 10 mg take  4 each am x 2 days,   2 each am x 2 days,  1 each am x 2 days and stop  Protonix (pantoprazole) Take 30-60 min before first meal of the day and Pepcid 20 mg one bedtime plus chlorpheniramine 4 mg x 2 at bedtime (both available over the counter)  until cough is completely gone for at least a week without the need for cough suppression  GERD (REFLUX)  is an extremely common cause of respiratory symptoms, many times with no significant heartburn at all.    It can be treated with medication, but also with lifestyle changes including avoidance of late meals, excessive alcohol, smoking cessation, and avoid fatty foods, chocolate, peppermint, colas, red wine, and acidic juices such as orange juice.  NO MINT OR MENTHOL PRODUCTS SO NO COUGH DROPS  USE SUGARLESS CANDY INSTEAD (jolley ranchers or Stover's or Life savers all available in sugarless)  NO OIL BASED VITAMINS - use powdered substitutes.  Return in 2 weeks if not better  Late add Needs sinus CT and cxr next

## 2014-04-02 NOTE — Progress Notes (Signed)
Subjective:    Patient ID: Jenna Foster, female    DOB: [birth year]   MRN: [id number]  HPI   70 yobf never smoker with  RA on Remicade and tendency bad bronchitis starting in [place] but more frequent since moved to [facility] 2004 and then developed more persistent cough since trip to [place] by bus in late March 2015 and coughing ever  since so referred by Jenna Foster for pulmonary eval 04/02/2014    04/02/2014 1st [facility] office visit/ Jenna Foster  Chief Complaint  Patient presents with  . Pulmonary Consult    Referred per Jenna Foster. Pt c/o cough since beginning of April 2015. She states that at first it was prod with green sputum, but has been non prod for approx 3 wks. Cough wakes her up from sleep approx twice per wk.    already eval UC and also by Eloise Foster rx with antibiotics/antihistmines/cough meds nothing works to date except the cough is not longer productive.  No obvious  patterns in day to day or daytime variabilty or assoc sob or cp or chest tightness, subjective wheeze or overt sinus   symptoms. No unusual exp hx or h/o childhood pna/ asthma or knowledge of premature birth.  Sleeping ok without nocturnal  or early am exacerbation  of respiratory  c/o's or need for noct saba. Also denies any obvious fluctuation of symptoms with weather or environmental changes or other aggravating or alleviating factors except as outlined above   Current Medications, Allergies, Complete Past Medical History, Past Surgical History, Family History, and Social History were reviewed in [employer] record.            Review of Systems  Constitutional: Negative for fever, chills and unexpected weight change.  HENT: Negative for congestion, dental problem, ear pain, nosebleeds, postnasal drip, rhinorrhea, sinus pressure, sneezing, sore throat, trouble swallowing and voice change.   Eyes: Negative for visual disturbance.  Respiratory: Positive for cough. Negative for choking and  shortness of breath.   Cardiovascular: Positive for leg swelling. Negative for chest pain.  Gastrointestinal: Negative for vomiting, abdominal pain and diarrhea.  Genitourinary: Negative for difficulty urinating.       Acid Heartburn Indigestion  Musculoskeletal: Negative for arthralgias.  Skin: Negative for rash.  Neurological: Negative for tremors, syncope and headaches.  Hematological: Does not bruise/bleed easily.       Objective:   Physical Exam  Hoarse amb bf nad  Wt Readings from Last 3 Encounters:  04/02/14 211 lb (95.709 kg)  08/19/13 220 lb (99.791 kg)  01/28/11 222 lb (100.699 kg)     HEENT: upper full/ lower partial , turbinates, and orophanx. Nl external ear canals without cough reflex   NECK :  without JVD/Nodes/TM/ nl carotid upstrokes bilaterally   LUNGS: no acc muscle use, clear to A and P bilaterally without cough on insp or exp maneuvers   CV:  RRR  no s3 or murmur or increase in P2, no edema   ABD:  soft and nontender with nl excursion in the supine position. No bruits or organomegaly, bowel sounds nl  MS:  warm without deformities, calf tenderness, cyanosis or clubbing  SKIN: warm and dry without lesions    NEURO:  alert, approp, no deficits   CXR Mar 29 2014 wnl    05/08/2009 1. Small hiatal hernia hiatal hernia with mild reflux. No delay  in passage of barium pill into the stomach.  2. 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  Assessment & Plan:

## 2014-04-03 DIAGNOSIS — R05 Cough: Secondary | ICD-10-CM | POA: Insufficient documentation

## 2014-04-03 DIAGNOSIS — R058 Other specified cough: Secondary | ICD-10-CM | POA: Insufficient documentation

## 2014-04-03 NOTE — Assessment & Plan Note (Addendum)
UGI 05/08/2009 1. Small hiatal hernia hiatal hernia with mild reflux.  Explained natural history of uri and why it's necessary in patients at risk (as she certainly is based on previous UGI ) to treat GERD aggressively  at least  short term   to reduce risk of evolving cyclical cough (which is likely what happened here)  initially  triggered by epithelial injury and a heightened sensitivty to the effects of any upper airway irritants,  most importantly acid - related.  That is, the more sensitive the epithelium damaged for virus, the more the cough, the more the secondary reflux (especially in those prone to reflux) the more the irritation of the sensitive mucosa and so on in a cyclical pattern.   The other concern is underlying sinus dz or adverse effect of generic cozar, which has been reported : For reasons that may related to vascular permability and nitric oxide pathways but not elevated  bradykinin levels (as seen with  ACEi use) losartan in the generic form has been reported now from mulitple sources  to cause a similar pattern of non-specific  upper airway symptoms as seen with acei.   This has not been reported with exposure to the other ARB's to date, so it seems reasonable for now to try either generic diovan or avapro if ARB needed or use an alternative class altogether.  See:  Dewayne Hatch Allergy Asthma Immunol  2008: 101: p 495-499    See instructions for specific recommendations which were reviewed directly with the patient who was given a copy with highlighter outlining the key components.   If not better then next step is return for cxr and sinus CT and trial off cozar

## 2014-04-04 ENCOUNTER — Other Ambulatory Visit: Payer: Self-pay

## 2014-04-04 DIAGNOSIS — Z1231 Encounter for screening mammogram for malignant neoplasm of breast: Secondary | ICD-10-CM

## 2014-04-23 DIAGNOSIS — M069 Rheumatoid arthritis, unspecified: Secondary | ICD-10-CM | POA: Diagnosis not present

## 2014-04-23 DIAGNOSIS — R7612 Nonspecific reaction to cell mediated immunity measurement of gamma interferon antigen response without active tuberculosis: Secondary | ICD-10-CM | POA: Diagnosis not present

## 2014-04-25 DIAGNOSIS — M069 Rheumatoid arthritis, unspecified: Secondary | ICD-10-CM | POA: Diagnosis not present

## 2014-04-26 DIAGNOSIS — I739 Peripheral vascular disease, unspecified: Secondary | ICD-10-CM | POA: Diagnosis not present

## 2014-04-26 DIAGNOSIS — L608 Other nail disorders: Secondary | ICD-10-CM | POA: Diagnosis not present

## 2014-04-26 DIAGNOSIS — E1159 Type 2 diabetes mellitus with other circulatory complications: Secondary | ICD-10-CM | POA: Diagnosis not present

## 2014-05-08 ENCOUNTER — Ambulatory Visit
Admission: RE | Admit: 2014-05-08 | Discharge: 2014-05-08 | Disposition: A | Discharge status: Home / Self Care | Payer: Medicare Other | Source: Ambulatory Visit

## 2014-05-08 DIAGNOSIS — Z1231 Encounter for screening mammogram for malignant neoplasm of breast: Secondary | ICD-10-CM | POA: Diagnosis not present

## 2014-06-07 DIAGNOSIS — M069 Rheumatoid arthritis, unspecified: Secondary | ICD-10-CM | POA: Diagnosis not present

## 2014-06-10 DIAGNOSIS — M069 Rheumatoid arthritis, unspecified: Secondary | ICD-10-CM | POA: Diagnosis not present

## 2014-07-03 DIAGNOSIS — E119 Type 2 diabetes mellitus without complications: Secondary | ICD-10-CM | POA: Diagnosis not present

## 2014-07-29 ENCOUNTER — Other Ambulatory Visit: Payer: Self-pay | Admitting: Internal Medicine

## 2014-07-29 DIAGNOSIS — M069 Rheumatoid arthritis, unspecified: Secondary | ICD-10-CM | POA: Diagnosis not present

## 2014-08-23 DIAGNOSIS — R6889 Other general symptoms and signs: Secondary | ICD-10-CM | POA: Diagnosis not present

## 2014-08-23 DIAGNOSIS — Z1331 Encounter for screening for depression: Secondary | ICD-10-CM | POA: Diagnosis not present

## 2014-08-23 DIAGNOSIS — Z23 Encounter for immunization: Secondary | ICD-10-CM | POA: Diagnosis not present

## 2014-08-23 DIAGNOSIS — I10 Essential (primary) hypertension: Secondary | ICD-10-CM | POA: Diagnosis not present

## 2014-08-23 DIAGNOSIS — E1149 Type 2 diabetes mellitus with other diabetic neurological complication: Secondary | ICD-10-CM | POA: Diagnosis not present

## 2014-08-23 DIAGNOSIS — K219 Gastro-esophageal reflux disease without esophagitis: Secondary | ICD-10-CM | POA: Diagnosis not present

## 2014-08-23 DIAGNOSIS — I739 Peripheral vascular disease, unspecified: Secondary | ICD-10-CM | POA: Diagnosis not present

## 2014-08-23 DIAGNOSIS — G4733 Obstructive sleep apnea (adult) (pediatric): Secondary | ICD-10-CM | POA: Diagnosis not present

## 2014-09-02 ENCOUNTER — Encounter: Payer: Self-pay | Admitting: Neurology

## 2014-09-02 ENCOUNTER — Ambulatory Visit (INDEPENDENT_AMBULATORY_CARE_PROVIDER_SITE_OTHER): Payer: Medicare Other | Admitting: Neurology

## 2014-09-02 VITALS — BP 148/69 | HR 86 | Temp 97.7°F | Ht 66.0 in | Wt 213.0 lb

## 2014-09-02 DIAGNOSIS — G4761 Periodic limb movement disorder: Secondary | ICD-10-CM

## 2014-09-02 DIAGNOSIS — G609 Hereditary and idiopathic neuropathy, unspecified: Secondary | ICD-10-CM

## 2014-09-02 DIAGNOSIS — G4733 Obstructive sleep apnea (adult) (pediatric): Secondary | ICD-10-CM | POA: Diagnosis not present

## 2014-09-02 DIAGNOSIS — R413 Other amnesia: Secondary | ICD-10-CM

## 2014-09-02 DIAGNOSIS — G2581 Restless legs syndrome: Secondary | ICD-10-CM | POA: Diagnosis not present

## 2014-09-02 NOTE — Patient Instructions (Signed)

## 2014-09-02 NOTE — Progress Notes (Signed)
Subjective:    Patient ID: Jenna Foster is a 73 y.o. female.  HPI     Huston Foley, MD, PhD [facility] [address] [address] [place], [place] [address]  Dear Dr. Eloise Harman,   I saw your patient, Jenna Foster, upon your kind request in my neurologic clinic today for initial consultation of her sleep disorder, in particular, reevaluation of her prior diagnosis of obstructive sleep apnea. The patient is accompanied by her husband today. 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 Her last sleep study was about 10 years ago. She complains of daytime somnolence and forgetfulness. Unfortunately, I do not have her prior sleep study results available for review. She was given a CPAP machine some 10 years ago and has been using her CPAP. She has been compliant when she is in bed, but she has recently had more daytime sleep and falls asleep sitting in the recliner. She has been snoring through the mask and she has a FFM, which can leak, per husband. She has had some memory loss in the last 3 months and also confusion while driving. She got lost while driving recently. She does not have this at bedtime or wake time. She has a tendency to fall asleep in the den and then goes to her bedroom in the early morning hours sometimes. She may sleep tilt 9 or 10 AM. She does not drink any caffeine. She is a nonsmoker and does not drink any alcohol. She has gained some weight since her last sleep study.  Her Past Medical History Is Significant For: Past Medical History  Diagnosis Date  . Diabetes mellitus   . Hypertension   . Asthma   . Hyperlipidemia   . OSA (obstructive sleep  apnea)     on CPAP     Her Past Surgical History Is Significant For: Past Surgical History  Procedure Laterality Date  . Cholecystectomy  1965  . Abdominal hysterectomy  1964    Her Family History Is Significant For: Family History  Problem Relation Age of Onset  . Diabetes Mother   . Hypertension Mother   . Alzheimer's disease Mother   . Stomach cancer Sister     Her Social History Is Significant For: History   Social History  . Marital Status: Married    Spouse Name: Dennard Nip    Number of Children: 3  . Years of Education: 11   Occupational History  . Retired    Social History Main Topics  . Smoking status: Never Smoker   . Smokeless tobacco: Never Used  . Alcohol Use: No  . Drug Use: No  . Sexual Activity: Not Currently   Other Topics Concern  . None   Social History Narrative  . None    Her Allergies Are:  Allergies  Allergen Reactions  . Bactrim Hives  . Sulfur Rash  :   Her Current Medications Are:  Outpatient Encounter Prescriptions as of 09/02/2014  Medication Sig  . aspirin 81 MG EC tablet Take 81 mg by mouth daily.   . Cyanocobalamin (VITAMIN B-12) 1000 MCG/15ML LIQD Take 10 mLs by mouth.  . InFLIXimab (REMICADE IV) Inject 100 mg into the vein every 7 (seven) weeks.   Marland Kitchen levothyroxine (SYNTHROID, LEVOTHROID)  50 MCG tablet TAKE 1 TABLET BY MOUTH DAILY ON AN EMPTY STOMACH  . losartan-hydrochlorothiazide (HYZAAR) 100-12.5 MG per tablet Take 1 tablet by mouth daily.   . metFORMIN (GLUCOPHAGE) 500 MG tablet Take 500 mg by mouth 2 (two) times daily with a meal.   . metoprolol tartrate (LOPRESSOR) 25 MG tablet Take 25 mg by mouth 2 (two) times daily.   . Multiple Vitamins-Minerals (CENTRUM SILVER PO) Take 1 tablet by mouth daily.    . pravastatin (PRAVACHOL) 40 MG tablet Take 1 tablet by mouth daily.  . [DISCONTINUED] pantoprazole (PROTONIX) 40 MG tablet TAKE 1 TABLET BY MOUTH EVERY DAY 30-60 MINUTES BEFORE THE FIRST MEAL OF THE DAY  . [DISCONTINUED]  predniSONE (DELTASONE) 10 MG tablet Take  4 each am x 2 days,   2 each am x 2 days,  1 each am x 2 days and stop  :  Review of Systems:  Out of a complete 14 point review of systems, all are reviewed and negative with the exception of these symptoms as listed below:   Review of Systems  HENT: Positive for hearing loss.   Respiratory: Positive for cough.   Gastrointestinal:       Incontinence  Endocrine: Positive for heat intolerance.  Musculoskeletal:       Aching muscles  Skin:       itching  Neurological:       Memory loss,snoring, restless legs  Psychiatric/Behavioral:       Disinterest in activities    Objective:  Neurologic Exam  Physical Exam Physical Examination:   Filed Vitals:   09/02/14 1050  BP: 148/69  Pulse: 86  Temp: 97.7 F (36.5 C)   General Examination: The patient is a very pleasant 73 y.o. female in no acute distress. She appears well-developed and well-nourished and well groomed.   HEENT: Normocephalic, atraumatic, pupils are equal, round and reactive to light and accommodation. She has mild bilateral cataracts. She has hearing aids in place. Funduscopic exam is normal with sharp disc margins noted. Extraocular tracking is good without limitation to gaze excursion or nystagmus noted. Normal smooth pursuit is noted. Hearing is grossly intact. Tympanic membranes are clear bilaterally. Face is symmetric with normal facial animation and normal facial sensation. Speech is clear with no dysarthria noted. There is no hypophonia. There is no lip, neck/head, jaw or voice tremor. Neck is supple with full range of passive and active motion. There are no carotid bruits on auscultation. Oropharynx exam reveals: mild mouth dryness, adequate dental hygiene and moderate airway crowding, due to redundant soft palate and longer uvula. Mallampati is class II. Tongue protrudes centrally and palate elevates symmetrically. Tonsils are either absent or very small. Neck size is 15  1/5 inches. She has a Mild overbite. Nasal inspection reveals no significant nasal mucosal bogginess or redness and no septal deviation.   Chest: Clear to auscultation without wheezing, rhonchi or crackles noted.  Heart: S1+S2+0, regular and normal without murmurs, rubs or gallops noted.   Abdomen: Soft, non-tender and non-distended with normal bowel sounds appreciated on auscultation.  Extremities: There is 2+ pitting edema in the distal lower extremities bilaterally. Pedal pulses are intact.  Skin: Warm and dry without trophic changes noted. There are no varicose veins.  Musculoskeletal: exam reveals no obvious joint deformities, tenderness or joint swelling or erythema.   Neurologically:  Mental status: The patient is awake, alert and oriented in all 4 spheres. Her immediate and remote memory, attention, language skills and fund of  knowledge are fairly appropriate. There is no evidence of aphasia, agnosia, apraxia or anomia. Speech is clear with normal prosody and enunciation. Thought process is linear. Mood is normal and affect is normal.  Cranial nerves II - XII are as described above under HEENT exam. In addition: shoulder shrug is normal with equal shoulder height noted. Motor exam: Normal bulk, strength and tone is noted. There is no drift, tremor or rebound. Romberg is negative. Reflexes are 1+ in the upper extremities, trace in the knees and absent in the ankles. Toes are flexor bilaterally. Fine motor skills and coordination: intact with normal finger taps, normal hand movements, normal rapid alternating patting, normal foot taps and normal foot agility.  Cerebellar testing: No dysmetria or intention tremor on finger to nose testing. Heel to shin is unremarkable bilaterally. There is no truncal or gait ataxia.  Sensory exam: intact to light touch, pinprick, vibration, temperature sense in the upper and lower extremities, with the exception of mild decrease in vibration sense in the  distal lower extremities..  Gait, station and balance: She stands with difficulty. No veering to one side is noted. No leaning to one side is noted. Posture is age-appropriate and stance is narrow based. Gait shows normal stride length and normal pace. No problems turning are noted. She turns in 3 steps. Tandem walk is difficult for her. Intact toe and heel stance is noted.               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 She reports daytime somnolence, memory loss, and ill fitting mask. Her machine is about 72 years old. She needs an updated CPAP machine and also would benefit from reevaluation with her proper sleep study to optimize her mask fitting and her pressure. She is in agreement. I had a long chat with the patient and her husband about my findings and the diagnosis of OSA, its prognosis and treatment options. We talked about medical treatments, surgical interventions and non-pharmacological approaches. I explained in particular the risks and ramifications of untreated moderate to severe OSA, especially with respect to developing cardiovascular disease down the Road, including congestive heart failure, difficult to treat hypertension, cardiac arrhythmias, or stroke. Even type 2 diabetes has, in part, been linked to untreated OSA. Symptoms of untreated OSA include daytime sleepiness, memory problems, mood irritability and mood disorder such as depression and anxiety, lack of energy, as well as recurrent headaches, especially morning headaches. We talked about trying to maintain a healthy lifestyle in general, as well as the importance of weight control. I  encouraged the patient to eat healthy, exercise daily and keep well hydrated, to keep a scheduled bedtime and wake time routine, to not skip any meals and eat healthy snacks in between meals. I advised the patient not to drive when feeling sleepy. I recommended the following at this time: sleep study with potential positive airway pressure titration. (We will score hypopneas at 4% and split the sleep study into diagnostic and treatment portion, if the estimated. 2 hour AHI is >15/h).   I explained the sleep test procedure to the patient and also outlined possible surgical and non-surgical treatment options of OSA, including the use of a custom-made dental device (  which would require a referral to a specialist dentist or oral surgeon), upper airway surgical options, such as pillar implants, radiofrequency surgery, tongue base surgery, and UPPP (which would involve a referral to an ENT surgeon). Rarely, jaw surgery such as mandibular advancement may be considered.  I also explained the CPAP treatment option to the patient, who indicated that she would be willing to try CPAP if the need arises. I explained the importance of being compliant with PAP treatment, not only for insurance purposes but primarily to improve Her symptoms, and for the patient's long term health benefit, including to reduce Her cardiovascular risks. I answered all their questions today and the patient and her husband were in agreement. I would like to see her back after the sleep study is completed and encouraged her to call with any interim questions, concerns, problems or updates.   Thank you very much for allowing me to participate in the care of this nice patient. If I can be of any further assistance to you please do not hesitate to call me at [phone].  Sincerely,   Huston Foley, MD, PhD

## 2014-09-05 DIAGNOSIS — R0600 Dyspnea, unspecified: Secondary | ICD-10-CM | POA: Diagnosis not present

## 2014-09-11 DIAGNOSIS — G5602 Carpal tunnel syndrome, left upper limb: Secondary | ICD-10-CM | POA: Diagnosis not present

## 2014-09-11 DIAGNOSIS — M5412 Radiculopathy, cervical region: Secondary | ICD-10-CM | POA: Diagnosis not present

## 2014-09-11 DIAGNOSIS — G603 Idiopathic progressive neuropathy: Secondary | ICD-10-CM | POA: Diagnosis not present

## 2014-09-11 DIAGNOSIS — M5417 Radiculopathy, lumbosacral region: Secondary | ICD-10-CM | POA: Diagnosis not present

## 2014-09-11 DIAGNOSIS — G5601 Carpal tunnel syndrome, right upper limb: Secondary | ICD-10-CM | POA: Diagnosis not present

## 2014-09-11 DIAGNOSIS — G3184 Mild cognitive impairment, so stated: Secondary | ICD-10-CM | POA: Diagnosis not present

## 2014-09-20 DIAGNOSIS — M0589 Other rheumatoid arthritis with rheumatoid factor of multiple sites: Secondary | ICD-10-CM | POA: Diagnosis not present

## 2014-11-04 DIAGNOSIS — M0589 Other rheumatoid arthritis with rheumatoid factor of multiple sites: Secondary | ICD-10-CM | POA: Diagnosis not present

## 2014-11-11 ENCOUNTER — Emergency Department (INDEPENDENT_AMBULATORY_CARE_PROVIDER_SITE_OTHER)
Admission: EM | Admit: 2014-11-11 | Discharge: 2014-11-11 | Disposition: A | Discharge status: Home / Self Care | Payer: Medicare Other | Source: Home / Self Care

## 2014-11-11 ENCOUNTER — Encounter (HOSPITAL_COMMUNITY): Payer: Self-pay | Admitting: Emergency Medicine

## 2014-11-11 DIAGNOSIS — R6884 Jaw pain: Secondary | ICD-10-CM | POA: Diagnosis not present

## 2014-11-11 MED ORDER — CEPHALEXIN 500 MG PO CAPS: 500.0000 mg | ORAL_CAPSULE | Freq: Three times a day (TID) | ORAL | Status: DC

## 2014-11-11 NOTE — ED Provider Notes (Signed)
Chief Complaint   Jaw Pain   History of Present Illness   Jenna Foster is a 73 year old female who has had a three-day history of pain and swelling of the left jaw. She has no teeth on that side, so she does not think it still related. It hurts to open her mouth or chew. She's noticed some dry mouth. She denies any bad taste in her mouth. She's had no fevers, chills, headache, stiff neck, or facial swelling other than around the jaw. She denies any pain or swelling in the neck. No chest pain or shortness of breath.  Review of Systems   Other than as noted above, the patient denies any of the following symptoms: Systemic:  No fevers or chills. Eye:  No redness, pain, discharge, itching, blurred vision, or diplopia. ENT:  No headache, nasal congestion, sneezing, itching, epistaxis, ear pain, decreased hearing, ringing in ears, vertigo, or tinnitus.  No oral lesions, sore throat, or hoarseness. Neck:  No neck pain or adenopathy. Skin:  No rash or itching.  PMFSH   Past medical history, family history, social history, meds, and allergies were reviewed. She is allergic to sulfa. She has diabetes, hypertension, and neuropathy. Current meds include nortriptyline, aspirin, vitamin B12, Remicade, Synthroid, Hyzaar, metformin, Lopressor, and Pravachol.  Physical Examination     Vital signs:  BP 103/47 mmHg  Pulse 80  Temp(Src) 97.9 F (36.6 C) (Oral)  Resp 16  SpO2 97% General:  Alert and oriented.  In no distress.  Skin warm and dry. Eye:  PERRL, full EOMs, lids and conjunctiva normal.   ENT:  TMs and canals clear.  Nasal mucosa not congested and without drainage.  Mucous membranes moist, no oral lesions, normal dentition, pharynx clear.  No cranial or facial pain to palplation. There is no pain or swelling over the mastoid. She has slight swelling over both parotid glands. This is symmetrical. There is tenderness to palpation of the left parotid. No mass was felt. She is able to open her  jaw little bit. No crepitus. Examine inside the mouth reveals slight erythema of Stinson's duct. No stone was visible or palpable. She has an entire upper plate and a lower partial with all of her molars and premolars being missing bilaterally. Neck:  Supple, full ROM.  No adenopathy, tenderness or mass.  Thyroid normal. Lungs:  Breath sounds clear and equal bilaterally.  No wheezes, rales or rhonchi. Heart:  Rhythm regular, without extrasystoles.  No gallops or murmers. Skin:  Clear, warm and dry.  Assessment   The encounter diagnosis was Jaw pain.  This is either sialoadenitis or TMJ syndrome. From her exam today I can't be sure which one it is. Will treat for sialoadenitis with antibiotics, hot soaks, increasing fluids, and several laminar drops. If no better in 1 week, follow-up with ENT.  Plan    1.  Meds:  The following meds were prescribed:   Discharge Medication List as of 11/11/2014 12:46 PM    START taking these medications   Details  cephALEXin (KEFLEX) 500 MG capsule Take 1 capsule (500 mg total) by mouth 3 (three) times daily., Starting 11/11/2014, Until Discontinued, Normal        2.  Patient Education/Counseling:  The patient was given appropriate handouts, self care instructions, and instructed in symptomatic relief.    3.  Follow up:  The patient was told to follow up here if no better in 3 to 4 days, or sooner if becoming worse in any way,  and given some red flag symptoms such as fever, headache, or stiff neck which would prompt immediate return.  Follow-up with Dr. Jenne Pane if no improvement in a week.     Reuben Likes, MD 11/11/14 [phone]

## 2014-11-11 NOTE — ED Notes (Signed)
Left jaw pain, pain from left ear down, onset Saturday.

## 2014-11-11 NOTE — Discharge Instructions (Signed)
Sialadenitis is an infection of the salivary glands: the parotid gland, located in front of the ears, and the submandibular gland located on the floor of the mouth.  This may or may not be associated with a stone in the duct of salivary gland.  In most cases, this will go away with the measures outlined below.  If not responding in a week, you will need to see an ear, nose, and throat doctor. ° °· Take the entire prescription of the antibiotics prescribed until they are completely gone.  °· Be sure to maintain adequate hydration.  This means drinking at least 8 to 10 glasses of water daily. °· It will help to stimulate the flow of saliva.  This can be done by sucking on sour lemon drops every 2 to 3 hours. °· Rinse out your mouth with hot saline solution (1/2 tsp of table salt plus a pinch of baking soda in 8 oz. of hot water) every 2 to 3 hours. °· Apply hot compresses to the painful area every 2 to 3 hours.  Take a washcloth or small hand towel, moisten it with water and put it in the microwave and microwave on high for 30 seconds.  Then apply to the painful area for 5 to 10 minutes. ° °If you get worse in any way, especially with high fever, worsening pain, difficulty breathing, or nausea and vomiting, return here right away for a recheck or go to the emergency department. ° °

## 2014-11-18 ENCOUNTER — Ambulatory Visit (INDEPENDENT_AMBULATORY_CARE_PROVIDER_SITE_OTHER): Payer: Medicare Other | Admitting: Neurology

## 2014-11-18 VITALS — BP 128/84

## 2014-11-18 DIAGNOSIS — G4733 Obstructive sleep apnea (adult) (pediatric): Secondary | ICD-10-CM | POA: Diagnosis not present

## 2014-11-18 DIAGNOSIS — G4710 Hypersomnia, unspecified: Secondary | ICD-10-CM

## 2014-11-18 DIAGNOSIS — G4761 Periodic limb movement disorder: Secondary | ICD-10-CM

## 2014-11-18 DIAGNOSIS — G4730 Sleep apnea, unspecified: Secondary | ICD-10-CM

## 2014-11-18 DIAGNOSIS — G479 Sleep disorder, unspecified: Secondary | ICD-10-CM

## 2014-11-18 NOTE — Sleep Study (Signed)
Please see the scanned sleep study interpretation located in the Procedure tab within the Chart Review section. 

## 2014-11-25 ENCOUNTER — Telehealth: Payer: Self-pay | Admitting: Neurology

## 2014-11-25 DIAGNOSIS — G4733 Obstructive sleep apnea (adult) (pediatric): Secondary | ICD-10-CM

## 2014-11-25 NOTE — Telephone Encounter (Signed)
Please call and notify patient that the recent sleep study confirmed the diagnosis of OSA. She did well with CPAP during the study with significant improvement of the respiratory events. Therefore, I would like start the patient on CPAP at home. I placed the order in the chart. Please note that the patient has an old CPAP machine but I am not sure if she has an existing DME provider. She qualifies for a new machine.  Arrange for CPAP set up at home through a DME company of patient's choice and fax/route report to PCP and referring MD (if other than PCP).   The patient will also need a follow up appointment with me in 6-8 weeks post set up that has to be scheduled; help the patient schedule this (in a follow-up slot).   Please re-enforce the importance of compliance with treatment and the need for [place] to monitor compliance data.   Once you have spoken to the patient and scheduled the return appointment, you may close this encounter, thanks,   Huston Foley, MD, PhD [facility] ([facility]

## 2014-11-28 ENCOUNTER — Encounter: Payer: Self-pay | Admitting: Neurology

## 2014-12-02 ENCOUNTER — Encounter: Payer: Self-pay | Admitting: *Deleted

## 2014-12-02 NOTE — Telephone Encounter (Signed)
Patient was contacted and provided the results of her split night sleep study.  Patient was informed that there was a positive diagnosis for OSA and that treatment in the form of CPAP therapy was advised.  Patient is a current CPAP user and her DME company is [employer].  Patient was referred to them for processing.  The patient gave verbal permission to mail a copy of her test results.  Dr. Jarome Matin was mailed a copy of the results.

## 2014-12-09 DIAGNOSIS — I739 Peripheral vascular disease, unspecified: Secondary | ICD-10-CM | POA: Diagnosis not present

## 2014-12-09 DIAGNOSIS — G4733 Obstructive sleep apnea (adult) (pediatric): Secondary | ICD-10-CM | POA: Diagnosis not present

## 2014-12-09 DIAGNOSIS — I10 Essential (primary) hypertension: Secondary | ICD-10-CM | POA: Diagnosis not present

## 2014-12-09 DIAGNOSIS — K219 Gastro-esophageal reflux disease without esophagitis: Secondary | ICD-10-CM | POA: Diagnosis not present

## 2014-12-09 DIAGNOSIS — Z6833 Body mass index (BMI) 33.0-33.9, adult: Secondary | ICD-10-CM | POA: Diagnosis not present

## 2014-12-09 DIAGNOSIS — Z1389 Encounter for screening for other disorder: Secondary | ICD-10-CM | POA: Diagnosis not present

## 2014-12-09 DIAGNOSIS — E1149 Type 2 diabetes mellitus with other diabetic neurological complication: Secondary | ICD-10-CM | POA: Diagnosis not present

## 2014-12-09 DIAGNOSIS — E1140 Type 2 diabetes mellitus with diabetic neuropathy, unspecified: Secondary | ICD-10-CM | POA: Diagnosis not present

## 2014-12-09 DIAGNOSIS — E1151 Type 2 diabetes mellitus with diabetic peripheral angiopathy without gangrene: Secondary | ICD-10-CM | POA: Diagnosis not present

## 2014-12-18 DIAGNOSIS — M79641 Pain in right hand: Secondary | ICD-10-CM | POA: Diagnosis not present

## 2014-12-18 DIAGNOSIS — M0579 Rheumatoid arthritis with rheumatoid factor of multiple sites without organ or systems involvement: Secondary | ICD-10-CM | POA: Diagnosis not present

## 2014-12-18 DIAGNOSIS — M79642 Pain in left hand: Secondary | ICD-10-CM | POA: Diagnosis not present

## 2014-12-25 DIAGNOSIS — M0589 Other rheumatoid arthritis with rheumatoid factor of multiple sites: Secondary | ICD-10-CM | POA: Diagnosis not present

## 2015-01-06 ENCOUNTER — Encounter: Payer: Self-pay | Admitting: Podiatry

## 2015-01-06 ENCOUNTER — Ambulatory Visit (INDEPENDENT_AMBULATORY_CARE_PROVIDER_SITE_OTHER): Payer: Medicare Other | Admitting: Podiatry

## 2015-01-06 VITALS — Resp 13 | Ht 65.5 in | Wt 204.0 lb

## 2015-01-06 DIAGNOSIS — M79676 Pain in unspecified toe(s): Secondary | ICD-10-CM

## 2015-01-06 DIAGNOSIS — B351 Tinea unguium: Secondary | ICD-10-CM

## 2015-01-06 NOTE — Patient Instructions (Signed)
Diabetes and Foot Care Diabetes may cause you to have problems because of poor blood supply (circulation) to your feet and legs. This may cause the skin on your feet to become thinner, break easier, and heal more slowly. Your skin may become dry, and the skin may peel and crack. You may also have nerve damage in your legs and feet causing decreased feeling in them. You may not notice minor injuries to your feet that could lead to infections or more serious problems. Taking care of your feet is one of the most important things you can do for yourself.  HOME CARE INSTRUCTIONS  Wear shoes at all times, even in the house. Do not go barefoot. Bare feet are easily injured.  Check your feet daily for blisters, cuts, and redness. If you cannot see the bottom of your feet, use a mirror or ask someone for help.  Wash your feet with warm water (do not use hot water) and mild soap. Then pat your feet and the areas between your toes until they are completely dry. Do not soak your feet as this can dry your skin.  Apply a moisturizing lotion or petroleum jelly (that does not contain alcohol and is unscented) to the skin on your feet and to dry, brittle toenails. Do not apply lotion between your toes.  Trim your toenails straight across. Do not dig under them or around the cuticle. File the edges of your nails with an emery board or nail file.  Do not cut corns or calluses or try to remove them with medicine.  Wear clean socks or stockings every day. Make sure they are not too tight. Do not wear knee-high stockings since they may decrease blood flow to your legs.  Wear shoes that fit properly and have enough cushioning. To break in new shoes, wear them for just a few hours a day. This prevents you from injuring your feet. Always look in your shoes before you put them on to be sure there are no objects inside.  Do not cross your legs. This may decrease the blood flow to your feet.  If you find a minor scrape,  cut, or break in the skin on your feet, keep it and the skin around it clean and dry. These areas may be cleansed with mild soap and water. Do not cleanse the area with peroxide, alcohol, or iodine.  When you remove an adhesive bandage, be sure not to damage the skin around it.  If you have a wound, look at it several times a day to make sure it is healing.  Do not use heating pads or hot water bottles. They may burn your skin. If you have lost feeling in your feet or legs, you may not know it is happening until it is too late.  Make sure your health care provider performs a complete foot exam at least annually or more often if you have foot problems. Report any cuts, sores, or bruises to your health care provider immediately. SEEK MEDICAL CARE IF:   You have an injury that is not healing.  You have cuts or breaks in the skin.  You have an ingrown nail.  You notice redness on your legs or feet.  You feel burning or tingling in your legs or feet.  You have pain or cramps in your legs and feet.  Your legs or feet are numb.  Your feet always feel cold. SEEK IMMEDIATE MEDICAL CARE IF:   There is increasing redness,   swelling, or pain in or around a wound.  There is a red line that goes up your leg.  Pus is coming from a wound.  You develop a fever or as directed by your health care provider.  You notice a bad smell coming from an ulcer or wound. Document Released: 11/12/2000 Document Revised: 07/18/2013 Document Reviewed: 04/24/2013 Exit[employer] Patient Information 2015 [employer]. This information is not intended to replace advice given to you by your health care provider. Make sure you discuss any questions you have with your health care provider.  

## 2015-01-06 NOTE — Progress Notes (Signed)
   Subjective:    Patient ID: Jenna Foster, female    DOB: Apr 09, 1941, 74 y.o.   MRN: [id number]  HPI Comments: N debridement L 10 toenails D and O long-term C elongated thickened  A diabetic pt and difficult to cut T none   she denies any history of skin ulceration or claudication  Review of Systems  HENT: Positive for hearing loss and sneezing.   Cardiovascular: Positive for leg swelling.  Genitourinary: Positive for urgency.  Musculoskeletal: Positive for arthralgias.  All other systems reviewed and are negative.      Objective:   Physical Exam  Orientated 3  Vascular: DP pulses 2/4 bilaterally PT pulses 2/4 bilaterally Pitting edema ankles bilaterally  Neurological: Ankle reflex equal and reactive bilaterally Vibratory sedation sensation reactive bilaterally Sensation to 10 g monofilament wire intact 5/5 bilaterally  Dermatological: The hallux toenails are hypertrophic, incurvated, discolored and tender to palpation Remaining toenails appear normal trophic  Musculoskeletal: HAV deformity right greater than left No restriction ankle, subtalar, midtarsal joints bilaterally       Assessment & Plan:   Assessment: Satisfactory neurovascular status 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

## 2015-01-08 ENCOUNTER — Encounter (HOSPITAL_COMMUNITY): Payer: Self-pay | Admitting: Emergency Medicine

## 2015-01-08 ENCOUNTER — Emergency Department (INDEPENDENT_AMBULATORY_CARE_PROVIDER_SITE_OTHER)
Admission: EM | Admit: 2015-01-08 | Discharge: 2015-01-08 | Disposition: A | Discharge status: Home / Self Care | Payer: Medicare Other | Source: Home / Self Care | Attending: Family Medicine | Admitting: Family Medicine

## 2015-01-08 ENCOUNTER — Emergency Department (INDEPENDENT_AMBULATORY_CARE_PROVIDER_SITE_OTHER): Payer: Medicare Other

## 2015-01-08 DIAGNOSIS — S93691A Other sprain of right foot, initial encounter: Secondary | ICD-10-CM

## 2015-01-08 DIAGNOSIS — S96811A Strain of other specified muscles and tendons at ankle and foot level, right foot, initial encounter: Secondary | ICD-10-CM

## 2015-01-08 DIAGNOSIS — M0589 Other rheumatoid arthritis with rheumatoid factor of multiple sites: Secondary | ICD-10-CM | POA: Diagnosis not present

## 2015-01-08 DIAGNOSIS — M7731 Calcaneal spur, right foot: Secondary | ICD-10-CM | POA: Diagnosis not present

## 2015-01-08 MED ORDER — TRAMADOL HCL 50 MG PO TABS: 50.0000 mg | ORAL_TABLET | Freq: Four times a day (QID) | ORAL | Status: DC | PRN

## 2015-01-08 NOTE — ED Notes (Signed)
Pt given d/c paperwork.  Pt stated understanding.  Pt still showing not ready for D/C on ED Manager.

## 2015-01-08 NOTE — ED Notes (Signed)
Pt states she woke up yesterday morning and discovered she was having pain in her right heal.  She is unable to bear weight on it.  She denies any injury to the foot.

## 2015-01-08 NOTE — Discharge Instructions (Signed)
Thank you for coming in today. Call or go to the emergency room if you get worse, have trouble breathing, have chest pains, or palpitations.  Follow up with your podiatrist.  Use the walker boot  Use tramadol for pain as needed.   Plantar Fasciitis (Heel Spur Syndrome) with Rehab The plantar fascia is a fibrous, ligament-like, soft-tissue structure that spans the bottom of the foot. Plantar fasciitis is a condition that causes pain in the foot due to inflammation of the tissue. SYMPTOMS   Pain and tenderness on the underneath side of the foot.  Pain that worsens with standing or walking. CAUSES  Plantar fasciitis is caused by irritation and injury to the plantar fascia on the underneath side of the foot. Common mechanisms of injury include:  Direct trauma to bottom of the foot.  Damage to a small nerve that runs under the foot where the main fascia attaches to the heel bone.  Stress placed on the plantar fascia due to bone spurs. RISK INCREASES WITH:   Activities that place stress on the plantar fascia (running, jumping, pivoting, or cutting).  Poor strength and flexibility.  Improperly fitted shoes.  Tight calf muscles.  Flat feet.  Failure to warm-up properly before activity.  Obesity. PREVENTION  Warm up and stretch properly before activity.  Allow for adequate recovery between workouts.  Maintain physical fitness:  Strength, flexibility, and endurance.  Cardiovascular fitness.  Maintain a health body weight.  Avoid stress on the plantar fascia.  Wear properly fitted shoes, including arch supports for individuals who have flat feet. PROGNOSIS  If treated properly, then the symptoms of plantar fasciitis usually resolve without surgery. However, occasionally surgery is necessary. RELATED COMPLICATIONS   Recurrent symptoms that may result in a chronic condition.  Problems of the lower back that are caused by compensating for the injury, such as  limping.  Pain or weakness of the foot during push-off following surgery.  Chronic inflammation, scarring, and partial or complete fascia tear, occurring more often from repeated injections. TREATMENT  Treatment initially involves the use of ice and medication to help reduce pain and inflammation. The use of strengthening and stretching exercises may help reduce pain with activity, especially stretches of the Achilles tendon. These exercises may be performed at home or with a therapist. Your caregiver may recommend that you use heel cups of arch supports to help reduce stress on the plantar fascia. Occasionally, corticosteroid injections are given to reduce inflammation. If symptoms persist for greater than 6 months despite non-surgical (conservative), then surgery may be recommended.  MEDICATION   If pain medication is necessary, then nonsteroidal anti-inflammatory medications, such as aspirin and ibuprofen, or other minor pain relievers, such as acetaminophen, are often recommended.  Do not take pain medication within 7 days before surgery.  Prescription pain relievers may be given if deemed necessary by your caregiver. Use only as directed and only as much as you need.  Corticosteroid injections may be given by your caregiver. These injections should be reserved for the most serious cases, because they may only be given a certain number of times. HEAT AND COLD  Cold treatment (icing) relieves pain and reduces inflammation. Cold treatment should be applied for 10 to 15 minutes every 2 to 3 hours for inflammation and pain and immediately after any activity that aggravates your symptoms. Use ice packs or massage the area with a piece of ice (ice massage).  Heat treatment may be used prior to performing the stretching and strengthening activities  prescribed by your caregiver, physical therapist, or athletic trainer. Use a heat pack or soak the injury in warm water. SEEK IMMEDIATE MEDICAL CARE  IF:  Treatment seems to offer no benefit, or the condition worsens.  Any medications produce adverse side effects. EXERCISES RANGE OF MOTION (ROM) AND STRETCHING EXERCISES - Plantar Fasciitis (Heel Spur Syndrome) These exercises may help you when beginning to rehabilitate your injury. Your symptoms may resolve with or without further involvement from your physician, physical therapist or athletic trainer. While completing these exercises, remember:   Restoring tissue flexibility helps normal motion to return to the joints. This allows healthier, less painful movement and activity.  An effective stretch should be held for at least 30 seconds.  A stretch should never be painful. You should only feel a gentle lengthening or release in the stretched tissue. RANGE OF MOTION - Toe Extension, Flexion  Sit with your right / left leg crossed over your opposite knee.  Grasp your toes and gently pull them back toward the top of your foot. You should feel a stretch on the bottom of your toes and/or foot.  Hold this stretch for __________ seconds.  Now, gently pull your toes toward the bottom of your foot. You should feel a stretch on the top of your toes and or foot.  Hold this stretch for __________ seconds. Repeat __________ times. Complete this stretch __________ times per day.  RANGE OF MOTION - Ankle Dorsiflexion, Active Assisted  Remove shoes and sit on a chair that is preferably not on a carpeted surface.  Place right / left foot under knee. Extend your opposite leg for support.  Keeping your heel down, slide your right / left foot back toward the chair until you feel a stretch at your ankle or calf. If you do not feel a stretch, slide your bottom forward to the edge of the chair, while still keeping your heel down.  Hold this stretch for __________ seconds. Repeat __________ times. Complete this stretch __________ times per day.  STRETCH - Gastroc, Standing  Place hands on  wall.  Extend right / left leg, keeping the front knee somewhat bent.  Slightly point your toes inward on your back foot.  Keeping your right / left heel on the floor and your knee straight, shift your weight toward the wall, not allowing your back to arch.  You should feel a gentle stretch in the right / left calf. Hold this position for __________ seconds. Repeat __________ times. Complete this stretch __________ times per day. STRETCH - Soleus, Standing  Place hands on wall.  Extend right / left leg, keeping the other knee somewhat bent.  Slightly point your toes inward on your back foot.  Keep your right / left heel on the floor, bend your back knee, and slightly shift your weight over the back leg so that you feel a gentle stretch deep in your back calf.  Hold this position for __________ seconds. Repeat __________ times. Complete this stretch __________ times per day. STRETCH - Gastrocsoleus, Standing  Note: This exercise can place a lot of stress on your foot and ankle. Please complete this exercise only if specifically instructed by your caregiver.   Place the ball of your right / left foot on a step, keeping your other foot firmly on the same step.  Hold on to the wall or a rail for balance.  Slowly lift your other foot, allowing your body weight to press your heel down over the edge of  the step.  You should feel a stretch in your right / left calf.  Hold this position for __________ seconds.  Repeat this exercise with a slight bend in your right / left knee. Repeat __________ times. Complete this stretch __________ times per day.  STRENGTHENING EXERCISES - Plantar Fasciitis (Heel Spur Syndrome)  These exercises may help you when beginning to rehabilitate your injury. They may resolve your symptoms with or without further involvement from your physician, physical therapist or athletic trainer. While completing these exercises, remember:   Muscles can gain both the  endurance and the strength needed for everyday activities through controlled exercises.  Complete these exercises as instructed by your physician, physical therapist or athletic trainer. Progress the resistance and repetitions only as guided. STRENGTH - Towel Curls  Sit in a chair positioned on a non-carpeted surface.  Place your foot on a towel, keeping your heel on the floor.  Pull the towel toward your heel by only curling your toes. Keep your heel on the floor.  If instructed by your physician, physical therapist or athletic trainer, add ____________________ at the end of the towel. Repeat __________ times. Complete this exercise __________ times per day. STRENGTH - Ankle Inversion  Secure one end of a rubber exercise band/tubing to a fixed object (table, pole). Loop the other end around your foot just before your toes.  Place your fists between your knees. This will focus your strengthening at your ankle.  Slowly, pull your big toe up and in, making sure the band/tubing is positioned to resist the entire motion.  Hold this position for __________ seconds.  Have your muscles resist the band/tubing as it slowly pulls your foot back to the starting position. Repeat __________ times. Complete this exercises __________ times per day.  Document Released: 11/15/2005 Document Revised: 02/07/2012 Document Reviewed: 02/27/2009 [facility] Patient Information 2015 [place], [place]. This information is not intended to replace advice given to you by your health care provider. Make sure you discuss any questions you have with your health care provider.

## 2015-01-08 NOTE — ED Provider Notes (Signed)
Carolin Quang is a 74 y.o. female who presents to Urgent Care today for right heel pain. Patient was in her normal state of health when she developed right heel pain yesterday morning. The pain is worse with ambulation and palpation. She's tried using it leftover planter fascia support which helped a little. She has not tried any medications. No fevers or chills. No injury.   Past Medical History  Diagnosis Date  . Diabetes mellitus   . Hypertension   . Asthma   . Hyperlipidemia   . OSA (obstructive sleep apnea)     on CPAP    Past Surgical History  Procedure Laterality Date  . Cholecystectomy  1965  . Abdominal hysterectomy  1964   History  Substance Use Topics  . Smoking status: Never Smoker   . Smokeless tobacco: Never Used  . Alcohol Use: No   ROS as above Medications: No current facility-administered medications for this encounter.   Current Outpatient Prescriptions  Medication Sig Dispense Refill  . abatacept (ORENCIA) 250 MG injection Inject into the vein.    Marland Kitchen aspirin 81 MG EC tablet Take 81 mg by mouth daily.     . Calcium Carbonate-Vitamin D (CALTRATE 600+D PO) Take by mouth.    . Cyanocobalamin (VITAMIN B-12) 1000 MCG/15ML LIQD Take 10 mLs by mouth.    . gabapentin (NEURONTIN) 300 MG capsule Take 300 mg by mouth at bedtime.    Marland Kitchen levothyroxine (SYNTHROID, LEVOTHROID) 50 MCG tablet TAKE 1 TABLET BY MOUTH DAILY ON AN EMPTY STOMACH    . losartan-hydrochlorothiazide (HYZAAR) 100-12.5 MG per tablet Take 1 tablet by mouth daily.     . metFORMIN (GLUCOPHAGE) 500 MG tablet Take 500 mg by mouth 2 (two) times daily with a meal.     . metoprolol tartrate (LOPRESSOR) 25 MG tablet Take 25 mg by mouth 2 (two) times daily.     . Multiple Vitamins-Minerals (CENTRUM SILVER PO) Take 1 tablet by mouth daily.      . pravastatin (PRAVACHOL) 40 MG tablet Take 1 tablet by mouth daily.    . traMADol (ULTRAM) 50 MG tablet Take 1 tablet (50 mg total) by mouth every 6 (six) hours as needed.  15 tablet 0   Allergies  Allergen Reactions  . Bactrim Hives  . Sulfur Rash     Exam:  BP 115/57 mmHg  Pulse 55  Temp(Src) 97.6 F (36.4 C) (Oral)  Resp 12  SpO2 96% Gen: Well NAD Right foot and ankle. Ankle nontender normal swelling normal motion stable ligaments exam Heel tender to palpation on the medial lateral surface and on the plantar surface. Pain with toe dorsiflexion. Skin is normal appearing  Limited musculoskeletal ultrasound the right foot:  The plantar calcaneus was ultrasounded. The ultrasound was technically difficult. The plantar fascial was visualized with a probable disruption at the origin of the os calcis.  The thickness of the fascia is 0.3 cm.  The Achilles tendon was normal appearing as were the peroneal tendons and posterior tibialis tendons Bony structures normal  No results found for this or any previous visit (from the past 24 hour(s)). Dg Os Calcis Right  01/08/2015   CLINICAL DATA:  Right heel pain, no known injury  EXAM: RIGHT OS CALCIS - 2+ VIEW  COMPARISON:  None.  FINDINGS: No acute fracture is noted. Small plantar spur is identified. No gross soft tissue abnormality is seen.  IMPRESSION: Small plantar spur.  No acute bony abnormality is noted.   Electronically Signed  By: Alcide Clever M.D.   On: 01/08/2015 13:12    Assessment and Plan: 74 y.o. female with probable right plantar fascia rupture. Treat with Cam Walker boot tramadol and follow-up with podiatry.  Discussed warning signs or symptoms. Please see discharge instructions. Patient expresses understanding.     Rodolph Bong, MD 01/08/15 [phone]

## 2015-01-22 DIAGNOSIS — M0579 Rheumatoid arthritis with rheumatoid factor of multiple sites without organ or systems involvement: Secondary | ICD-10-CM | POA: Diagnosis not present

## 2015-02-05 ENCOUNTER — Ambulatory Visit (INDEPENDENT_AMBULATORY_CARE_PROVIDER_SITE_OTHER): Payer: Medicare Other | Admitting: Neurology

## 2015-02-05 ENCOUNTER — Encounter: Payer: Self-pay | Admitting: Neurology

## 2015-02-05 VITALS — BP 132/54 | HR 63 | Temp 97.9°F | Resp 18 | Ht 66.0 in | Wt 201.0 lb

## 2015-02-05 DIAGNOSIS — G4733 Obstructive sleep apnea (adult) (pediatric): Secondary | ICD-10-CM | POA: Diagnosis not present

## 2015-02-05 DIAGNOSIS — E0842 Diabetes mellitus due to underlying condition with diabetic polyneuropathy: Secondary | ICD-10-CM

## 2015-02-05 DIAGNOSIS — M7989 Other specified soft tissue disorders: Secondary | ICD-10-CM

## 2015-02-05 DIAGNOSIS — R413 Other amnesia: Secondary | ICD-10-CM

## 2015-02-05 NOTE — Patient Instructions (Addendum)
Please continue using your CPAP regularly. While your insurance requires that you use CPAP at least 4 hours each night on 70% of the nights, I recommend, that you not skip any nights and use it throughout the night if you can. Getting used to CPAP and staying with the treatment long term does take time and patience and discipline. Untreated obstructive sleep apnea when it is moderate to severe can have an adverse impact on cardiovascular health and raise her risk for heart disease, arrhythmias, hypertension, congestive heart failure, stroke and diabetes. Untreated obstructive sleep apnea causes sleep disruption, nonrestorative sleep, and sleep deprivation. This can have an impact on your day to day functioning and cause daytime sleepiness and impairment of cognitive function, memory loss, mood disturbance, and problems focussing. Using CPAP regularly can improve these symptoms.  Your memory loss is rather mild and we can monitor.   Remember to drink plenty of fluid, eat healthy meals and do not skip any meals. Try to eat protein with a every meal and eat a healthy snack such as fruit or nuts in between meals. Try to keep a regular sleep-wake schedule and try to exercise daily, particularly in the form of walking, 20-30 minutes a day, if you can. Good nutrition, proper sleep and exercise can help her cognitive function.  Engage in social activities in your community and with your family and try to keep up with current events by reading the newspaper or watching the news. If you have computer and can go online, try [employer]. Also, you may like to do word finding puzzles or crossword puzzles. Our phone number is [phone]. We also have an after hours call service for urgent matters and there is a physician on-call for urgent questions. For any emergencies you know to call 911 or go to the nearest emergency room.

## 2015-02-05 NOTE — Progress Notes (Signed)
Subjective:    Patient ID: Jenna Foster is a 74 y.o. female.  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 The patient is unaccompanied today. I first met her on 09/02/2014 at the request of her primary care physician, at which time the patient reported a prior diagnosis of obstructive sleep apnea several years ago maybe as many as 10 years prior. She was given  a CPAP machine and was compliant with treatment. Her machine was over 54 years old and she had not had any reevaluation of her sleep apnea and years. I invited her back for sleep study and she had a split-night sleep study on  11/18/2014. I went over her test results with her in detail today. Her baseline sleep efficiency was severely reduced at 35.7% with a latency to sleep prolonged at 126.5 minutes and wake after sleep onset of 19.5 minutes with mild sleep fragmentation noted. She had an arousal index of 14.1 per hour. She had an increased percentage of stage II sleep and absence of slow-wave sleep and REM sleep prior to CPAP initiation. She had no significant PLMS or EKG changes. She had mild to moderate snoring. She had a total AHI of 35.6 per hour. Baseline oxygen saturation was 96% with a nadir of 84%. She was then titrated on CPAP. Sleep efficiency was 68.9%. She was titrated on CPAP from 5-12 cm with an AHI of 2.9 at the final pressure. Supine REM sleep was achieved. Average oxygen saturation was 96% with a nadir of 86%. Based on the test results are prescribed a new CPAP machine for her.  Today, I reviewed her compliance data from 01/05/2015 through 02/03/2015 which is a  total of 30 days during which time she used her machine every night with percent used days greater than 4 hours of 97%, indicating excellent compliance with an average usage of 7 hours and 16 minutes on a pressure of 12 cm with EPR of 2. Residual AHI was acceptable at 3.4 per hour with a low leak at 3.4 L/m for the 95th percentile.  Today, she reports some residual daytime fatigue. She is no longer on Remicade and started Orencia, which will be once a month now, with Dr. Ouida Sills. She takes a nap, usually while sitting in the recliner in the afternoon, watching TV. She likes her new CPAP and has adjusted well to the treatment. She has been able to tolerate the new medication. She has no new complaints otherwise. She is compliant with treatment.  She was diagnosed with obstructive sleep apnea several years ago. Her last sleep study was about 10 years ago. She complains of daytime somnolence and forgetfulness. Unfortunately, I did not have her prior sleep study results available for review. She was given a CPAP machine some 10 years ago and has been using her CPAP. She has been compliant when she is in bed, but she had more daytime sleepiness. She has been snoring through the mask and she has a FFM, which can leak, per husband. She had recent memory issues and also confusion while driving. She got lost while driving once.  She has a tendency to fall asleep in the den and then goes to her bedroom  in the early morning hours sometimes. She may sleep tilt 9 or 10 AM. She does not drink any caffeine. She is a nonsmoker and does not drink any alcohol. She had gained some weight since her last sleep study.   Her Past Medical History Is Significant For: Past Medical History  Diagnosis Date  . Diabetes mellitus   . Hypertension   . Asthma   . Hyperlipidemia   . OSA (obstructive sleep apnea)     on CPAP     Her Past Surgical History Is Significant For: Past Surgical History  Procedure Laterality Date  .  Cholecystectomy  1965  . Abdominal hysterectomy  1964    Her Family History Is Significant For: Family History  Problem Relation Age of Onset  . Diabetes Mother   . Hypertension Mother   . Alzheimer's disease Mother   . Stomach cancer Sister     Her Social History Is Significant For: History   Social History  . Marital Status: Married    Spouse Name: Cornelia Copa  . Number of Children: 3  . Years of Education: 11   Occupational History  . Retired    Social History Main Topics  . Smoking status: Never Smoker   . Smokeless tobacco: Never Used  . Alcohol Use: No  . Drug Use: No  . Sexual Activity: Not Currently   Other Topics Concern  . Not on file   Social History Narrative    Her Allergies Are:  Allergies  Allergen Reactions  . Bactrim Hives  . Sulfur Rash  :   Her Current Medications Are:  Outpatient Encounter Prescriptions as of 02/05/2015  Medication Sig  . abatacept (ORENCIA) 250 MG injection Inject into the vein.  Marland Kitchen aspirin 81 MG EC tablet Take 81 mg by mouth daily.   . Calcium Carbonate-Vitamin D (CALTRATE 600+D PO) Take by mouth.  . Cyanocobalamin (VITAMIN B-12) 1000 MCG/15ML LIQD Take 10 mLs by mouth.  . levothyroxine (SYNTHROID, LEVOTHROID) 50 MCG tablet TAKE 1 TABLET BY MOUTH DAILY ON AN EMPTY STOMACH  . losartan-hydrochlorothiazide (HYZAAR) 100-12.5 MG per tablet Take 1 tablet by mouth daily.   . metFORMIN (GLUCOPHAGE) 500 MG tablet Take 500 mg by mouth 2 (two) times daily with a meal.   . metoprolol tartrate (LOPRESSOR) 25 MG tablet Take 25 mg by mouth 2 (two) times daily.   . Multiple Vitamins-Minerals (CENTRUM SILVER PO) Take 1 tablet by mouth daily.    . pravastatin (PRAVACHOL) 40 MG tablet Take 1 tablet by mouth daily.  Marland Kitchen gabapentin (NEURONTIN) 300 MG capsule Take 300 mg by mouth at bedtime.  . [DISCONTINUED] traMADol (ULTRAM) 50 MG tablet Take 1 tablet (50 mg total) by mouth every 6 (six) hours as needed.  :  Review of Systems:  Out of a complete  14 point review of systems, all are reviewed and negative with the exception of these symptoms as listed below:   Review of Systems  Neurological:       Memory loss, Sleeping in afternoon    Objective:  Neurologic Exam  Physical Exam Physical Examination:   Filed Vitals:   02/05/15 0956  BP: 132/54  Pulse: 63  Temp: 97.9 F (36.6 C)  Resp: 18   General Examination: The patient is a very pleasant 74 y.o. female in no acute distress. She appears well-developed and well-nourished and well groomed.   HEENT: Normocephalic, atraumatic, pupils are equal, round and reactive to light and accommodation. She has mild bilateral cataracts, unchanged. Funduscopic  exam is normal with sharp disc margins noted. Extraocular tracking is good without limitation to gaze excursion or nystagmus noted. Normal smooth pursuit is noted. Hearing is grossly intact, hearing aids in place b/l. Face is symmetric with normal facial animation and normal facial sensation. Speech is clear with no dysarthria noted. There is no hypophonia. There is no lip, neck/head, jaw or voice tremor. Neck is supple with full range of passive and active motion. There are no carotid bruits on auscultation. Oropharynx exam reveals: mild mouth dryness, adequate dental hygiene and moderate airway crowding, due to redundant soft palate and longer uvula. Mallampati is class II. Tongue protrudes centrally and palate elevates symmetrically. Tonsils are either absent or very small. She has a Mild overbite. Nasal inspection reveals no significant nasal mucosal bogginess or redness and no septal deviation.   Chest: Clear to auscultation without wheezing, rhonchi or crackles noted.  Heart: S1+S2+0, regular and normal without murmurs, rubs or gallops noted.   Abdomen: Soft, non-tender and non-distended with normal bowel sounds appreciated on auscultation.  Extremities: There is 2+ pitting edema in the distal lower extremities bilaterally. She is  wearing compression stockings up to the knees. Pedal pulses are intact.  Skin: Warm and dry without trophic changes noted. There are no varicose veins.  Musculoskeletal: exam reveals no obvious joint deformities, tenderness or joint swelling or erythema.   Neurologically:  Mental status: The patient is awake, alert and oriented in all 4 spheres. Her immediate and remote memory, attention, language skills and fund of knowledge are fairly appropriate. There is no evidence of aphasia, agnosia, apraxia or anomia. Speech is clear with normal prosody and enunciation. Thought process is linear. Mood is normal and affect is normal.   On 02/05/2015: MMSE: 29/30, CDT: 4/4, AFT: 12/min, GDS: 3/15.   Cranial nerves II - XII are as described above under HEENT exam. In addition: shoulder shrug is normal with equal shoulder height noted. Motor exam: Normal bulk, strength and tone is noted. There is no drift, tremor or rebound. Romberg is negative. Reflexes are 1+ in the upper extremities, trace in the knees and absent in the ankles. Fine motor skills and coordination: intact with normal finger taps, normal hand movements, normal rapid alternating patting, normal foot taps and normal foot agility.  Cerebellar testing: No dysmetria or intention tremor on finger to nose testing. Heel to shin is slightly dfficult d/t decrease in ROM. There is no truncal or gait ataxia.  Sensory exam: intact to light touch, pinprick, vibration, temperature sense in the upper and lower extremities, with the exception of mild decrease in vibration sense in the distal lower extremities. Gait, station and balance: She stands with difficulty. No veering to one side is noted. No leaning to one side is noted. Posture is age-appropriate and stance is narrow based. Gait shows normal stride length and normal pace. No problems turning are noted. She turns in 3 steps. Tandem walk is difficult for her, unchanged.   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 She presents for follow-up consultation of her obstructive sleep apnea, treated with CPAP. She had a split-night sleep study in my went over her test results with her in detail today. We also talked about her  compliance data and I pointed out the data points to her. She is congratulated on her excellent compliance. She feels that she has adjusted well to the new machine and treatment. She has some residual daytime fatigue and sometimes falls asleep in the recliner, watching TV. She does not usually plan to take a nap. She is advised that when she lays down to take a nap she needs to use her CPAP machine. Fatigue is likely a function of multiple players including medications, a new medication for rheumatoid arthritis, her overall medical history in general. She is advised to allow for enough sleep time at night and stay well-hydrated. We talked about her memory complaints. She feels that sometimes she confusion and slowness in thinking. She has no major new complaints with regard to her memory loss and her MMSE is reassuring at 61. We will continue to monitor. Again this may be a function of multiple players. At this juncture, I would not recommend any new medications. She is encouraged to continue using CPAP regularly and we talked about signs and symptoms of untreated OSA include daytime sleepiness, memory problems, mood irritability and mood disorder such as depression and anxiety, lack of energy, as well as recurrent headaches, especially morning headaches. We talked about trying to maintain a healthy lifestyle in general, as well as the importance of weight control. I  explained the importance of being compliant with PAP treatment, not only for insurance purposes but primarily to improve Her symptoms, and for the patient's long term health benefit, including to reduce Her cardiovascular risks. I would like to see her back in 6 months, sooner if the need arises. I answered all her questions today and the patient was in agreement.

## 2015-02-07 ENCOUNTER — Encounter: Payer: Self-pay | Admitting: Neurology

## 2015-02-10 ENCOUNTER — Encounter: Payer: Self-pay | Admitting: Neurology

## 2015-02-19 DIAGNOSIS — I10 Essential (primary) hypertension: Secondary | ICD-10-CM | POA: Diagnosis not present

## 2015-02-19 DIAGNOSIS — M0579 Rheumatoid arthritis with rheumatoid factor of multiple sites without organ or systems involvement: Secondary | ICD-10-CM | POA: Diagnosis not present

## 2015-02-19 DIAGNOSIS — N390 Urinary tract infection, site not specified: Secondary | ICD-10-CM | POA: Diagnosis not present

## 2015-02-19 DIAGNOSIS — M109 Gout, unspecified: Secondary | ICD-10-CM | POA: Diagnosis not present

## 2015-02-19 DIAGNOSIS — E1140 Type 2 diabetes mellitus with diabetic neuropathy, unspecified: Secondary | ICD-10-CM | POA: Diagnosis not present

## 2015-02-19 DIAGNOSIS — Z008 Encounter for other general examination: Secondary | ICD-10-CM | POA: Diagnosis not present

## 2015-02-24 DIAGNOSIS — Z1212 Encounter for screening for malignant neoplasm of rectum: Secondary | ICD-10-CM | POA: Diagnosis not present

## 2015-02-26 DIAGNOSIS — R413 Other amnesia: Secondary | ICD-10-CM | POA: Diagnosis not present

## 2015-02-26 DIAGNOSIS — M0579 Rheumatoid arthritis with rheumatoid factor of multiple sites without organ or systems involvement: Secondary | ICD-10-CM | POA: Diagnosis not present

## 2015-02-28 DIAGNOSIS — E039 Hypothyroidism, unspecified: Secondary | ICD-10-CM | POA: Diagnosis not present

## 2015-02-28 DIAGNOSIS — E1140 Type 2 diabetes mellitus with diabetic neuropathy, unspecified: Secondary | ICD-10-CM | POA: Diagnosis not present

## 2015-02-28 DIAGNOSIS — M109 Gout, unspecified: Secondary | ICD-10-CM | POA: Diagnosis not present

## 2015-02-28 DIAGNOSIS — I739 Peripheral vascular disease, unspecified: Secondary | ICD-10-CM | POA: Diagnosis not present

## 2015-02-28 DIAGNOSIS — F0390 Unspecified dementia without behavioral disturbance: Secondary | ICD-10-CM | POA: Diagnosis not present

## 2015-02-28 DIAGNOSIS — Z0000 Encounter for general adult medical examination without abnormal findings: Secondary | ICD-10-CM | POA: Diagnosis not present

## 2015-02-28 DIAGNOSIS — G2581 Restless legs syndrome: Secondary | ICD-10-CM | POA: Diagnosis not present

## 2015-02-28 DIAGNOSIS — E1149 Type 2 diabetes mellitus with other diabetic neurological complication: Secondary | ICD-10-CM | POA: Diagnosis not present

## 2015-02-28 DIAGNOSIS — Z6833 Body mass index (BMI) 33.0-33.9, adult: Secondary | ICD-10-CM | POA: Diagnosis not present

## 2015-02-28 DIAGNOSIS — G4733 Obstructive sleep apnea (adult) (pediatric): Secondary | ICD-10-CM | POA: Diagnosis not present

## 2015-02-28 DIAGNOSIS — M069 Rheumatoid arthritis, unspecified: Secondary | ICD-10-CM | POA: Diagnosis not present

## 2015-02-28 DIAGNOSIS — K219 Gastro-esophageal reflux disease without esophagitis: Secondary | ICD-10-CM | POA: Diagnosis not present

## 2015-03-19 DIAGNOSIS — M0579 Rheumatoid arthritis with rheumatoid factor of multiple sites without organ or systems involvement: Secondary | ICD-10-CM | POA: Diagnosis not present

## 2015-04-07 ENCOUNTER — Other Ambulatory Visit: Payer: Self-pay

## 2015-04-07 DIAGNOSIS — Z1231 Encounter for screening mammogram for malignant neoplasm of breast: Secondary | ICD-10-CM

## 2015-04-09 DIAGNOSIS — G5601 Carpal tunnel syndrome, right upper limb: Secondary | ICD-10-CM | POA: Diagnosis not present

## 2015-04-09 DIAGNOSIS — G3184 Mild cognitive impairment, so stated: Secondary | ICD-10-CM | POA: Diagnosis not present

## 2015-04-09 DIAGNOSIS — G5602 Carpal tunnel syndrome, left upper limb: Secondary | ICD-10-CM | POA: Diagnosis not present

## 2015-04-09 DIAGNOSIS — G4701 Insomnia due to medical condition: Secondary | ICD-10-CM | POA: Diagnosis not present

## 2015-04-09 DIAGNOSIS — M5412 Radiculopathy, cervical region: Secondary | ICD-10-CM | POA: Diagnosis not present

## 2015-04-09 DIAGNOSIS — M5417 Radiculopathy, lumbosacral region: Secondary | ICD-10-CM | POA: Diagnosis not present

## 2015-04-09 DIAGNOSIS — G603 Idiopathic progressive neuropathy: Secondary | ICD-10-CM | POA: Diagnosis not present

## 2015-04-23 DIAGNOSIS — M0579 Rheumatoid arthritis with rheumatoid factor of multiple sites without organ or systems involvement: Secondary | ICD-10-CM | POA: Diagnosis not present

## 2015-05-12 ENCOUNTER — Ambulatory Visit
Admission: RE | Admit: 2015-05-12 | Discharge: 2015-05-12 | Disposition: A | Discharge status: Home / Self Care | Payer: Medicare Other | Source: Ambulatory Visit

## 2015-05-12 DIAGNOSIS — Z1231 Encounter for screening mammogram for malignant neoplasm of breast: Secondary | ICD-10-CM | POA: Diagnosis not present

## 2015-05-14 ENCOUNTER — Emergency Department (HOSPITAL_COMMUNITY): Payer: Medicare Other

## 2015-05-14 ENCOUNTER — Emergency Department (HOSPITAL_BASED_OUTPATIENT_CLINIC_OR_DEPARTMENT_OTHER)
Admit: 2015-05-14 | Discharge: 2015-05-14 | Disposition: A | Discharge status: Home / Self Care | Payer: Medicare Other | Attending: Emergency Medicine | Admitting: Emergency Medicine

## 2015-05-14 ENCOUNTER — Emergency Department (HOSPITAL_COMMUNITY)
Admission: EM | Admit: 2015-05-14 | Discharge: 2015-05-14 | Disposition: A | Discharge status: Home / Self Care | Payer: Medicare Other | Attending: Emergency Medicine | Admitting: Emergency Medicine

## 2015-05-14 ENCOUNTER — Encounter (HOSPITAL_COMMUNITY): Payer: Self-pay | Admitting: Emergency Medicine

## 2015-05-14 DIAGNOSIS — J45909 Unspecified asthma, uncomplicated: Secondary | ICD-10-CM | POA: Insufficient documentation

## 2015-05-14 DIAGNOSIS — Z7982 Long term (current) use of aspirin: Secondary | ICD-10-CM | POA: Diagnosis not present

## 2015-05-14 DIAGNOSIS — M25511 Pain in right shoulder: Secondary | ICD-10-CM

## 2015-05-14 DIAGNOSIS — M79609 Pain in unspecified limb: Secondary | ICD-10-CM | POA: Diagnosis not present

## 2015-05-14 DIAGNOSIS — E119 Type 2 diabetes mellitus without complications: Secondary | ICD-10-CM | POA: Diagnosis not present

## 2015-05-14 DIAGNOSIS — M13811 Other specified arthritis, right shoulder: Secondary | ICD-10-CM | POA: Diagnosis not present

## 2015-05-14 DIAGNOSIS — I10 Essential (primary) hypertension: Secondary | ICD-10-CM | POA: Insufficient documentation

## 2015-05-14 DIAGNOSIS — M1990 Unspecified osteoarthritis, unspecified site: Secondary | ICD-10-CM

## 2015-05-14 DIAGNOSIS — E785 Hyperlipidemia, unspecified: Secondary | ICD-10-CM | POA: Diagnosis not present

## 2015-05-14 DIAGNOSIS — G4733 Obstructive sleep apnea (adult) (pediatric): Secondary | ICD-10-CM | POA: Diagnosis not present

## 2015-05-14 DIAGNOSIS — Z79899 Other long term (current) drug therapy: Secondary | ICD-10-CM | POA: Insufficient documentation

## 2015-05-14 DIAGNOSIS — M19011 Primary osteoarthritis, right shoulder: Secondary | ICD-10-CM | POA: Insufficient documentation

## 2015-05-14 LAB — CBG MONITORING, ED: Glucose-Capillary: 144 mg/dL — ABNORMAL HIGH (ref 65–99)

## 2015-05-14 NOTE — ED Notes (Signed)
Pt sts right sided shoulder pain down arm upon waking today; pt denies obvious injury; CMS intact

## 2015-05-14 NOTE — Discharge Instructions (Signed)
Please call your doctor for a followup appointment within 24-48 hours. When you talk to your doctor please let them know that you were seen in the emergency department and have them acquire all of your records so that they can discuss the findings with you and formulate a treatment plan to fully care for your new and ongoing problems. Please follow-up with your primary care provider to be seen and reassessed Please follow-up with orthopedics Please rest and stay hydrated Please avoid any physical or strenuous activity Please keep arm in sling at all times especially when active and when sleeping Please apply warm compressions and massage Please continue to monitor symptoms closely and if symptoms are to worsen or change (fever greater than 101, chills, sweating, nausea, vomiting, chest pain, shortness of breathe, difficulty breathing, weakness, numbness, tingling, worsening or changes to pain pattern, fall, injury, loss of sensation, changes to skin color, arm swelling, weakness, dropping of objects, red streaks running down the arm) please report back to the Emergency Department immediately.   Arthritis, Nonspecific Arthritis is inflammation of a joint. This usually means pain, redness, warmth or swelling are present. One or more joints may be involved. There are a number of types of arthritis. Your caregiver may not be able to tell what type of arthritis you have right away. CAUSES  The most common cause of arthritis is the wear and tear on the joint (osteoarthritis). This causes damage to the cartilage, which can break down over time. The knees, hips, back and neck are most often affected by this type of arthritis. Other types of arthritis and common causes of joint pain include:  Sprains and other injuries near the joint. Sometimes minor sprains and injuries cause pain and swelling that develop hours later.  Rheumatoid arthritis. This affects hands, feet and knees. It usually affects both sides  of your body at the same time. It is often associated with chronic ailments, fever, weight loss and general weakness.  Crystal arthritis. Gout and pseudo gout can cause occasional acute severe pain, redness and swelling in the foot, ankle, or knee.  Infectious arthritis. Bacteria can get into a joint through a break in overlying skin. This can cause infection of the joint. Bacteria and viruses can also spread through the blood and affect your joints.  Drug, infectious and allergy reactions. Sometimes joints can become mildly painful and slightly swollen with these types of illnesses. SYMPTOMS   Pain is the main symptom.  Your joint or joints can also be red, swollen and warm or hot to the touch.  You may have a fever with certain types of arthritis, or even feel overall ill.  The joint with arthritis will hurt with movement. Stiffness is present with some types of arthritis. DIAGNOSIS  Your caregiver will suspect arthritis based on your description of your symptoms and on your exam. Testing may be needed to find the type of arthritis:  Blood and sometimes urine tests.  X-ray tests and sometimes CT or MRI scans.  Removal of fluid from the joint (arthrocentesis) is done to check for bacteria, crystals or other causes. Your caregiver (or a specialist) will numb the area over the joint with a local anesthetic, and use a needle to remove joint fluid for examination. This procedure is only minimally uncomfortable.  Even with these tests, your caregiver may not be able to tell what kind of arthritis you have. Consultation with a specialist (rheumatologist) may be helpful. TREATMENT  Your caregiver will discuss with you  treatment specific to your type of arthritis. If the specific type cannot be determined, then the following general recommendations may apply. Treatment of severe joint pain includes:  Rest.  Elevation.  Anti-inflammatory medication (for example, ibuprofen) may be  prescribed. Avoiding activities that cause increased pain.  Only take over-the-counter or prescription medicines for pain and discomfort as recommended by your caregiver.  Cold packs over an inflamed joint may be used for 10 to 15 minutes every hour. Hot packs sometimes feel better, but do not use overnight. Do not use hot packs if you are diabetic without your caregiver's permission.  A cortisone shot into arthritic joints may help reduce pain and swelling.  Any acute arthritis that gets worse over the next 1 to 2 days needs to be looked at to be sure there is no joint infection. Long-term arthritis treatment involves modifying activities and lifestyle to reduce joint stress jarring. This can include weight loss. Also, exercise is needed to nourish the joint cartilage and remove waste. This helps keep the muscles around the joint strong. HOME CARE INSTRUCTIONS   Do not take aspirin to relieve pain if gout is suspected. This elevates uric acid levels.  Only take over-the-counter or prescription medicines for pain, discomfort or fever as directed by your caregiver.  Rest the joint as much as possible.  If your joint is swollen, keep it elevated.  Use crutches if the painful joint is in your leg.  Drinking plenty of fluids may help for certain types of arthritis.  Follow your caregiver's dietary instructions.  Try low-impact exercise such as:  Swimming.  Water aerobics.  Biking.  Walking.  Morning stiffness is often relieved by a warm shower.  Put your joints through regular range-of-motion. SEEK MEDICAL CARE IF:   You do not feel better in 24 hours or are getting worse.  You have side effects to medications, or are not getting better with treatment. SEEK IMMEDIATE MEDICAL CARE IF:   You have a fever.  You develop severe joint pain, swelling or redness.  Many joints are involved and become painful and swollen.  There is severe back pain and/or leg weakness.  You  have loss of bowel or bladder control. Document Released: 12/23/2004 Document Revised: 02/07/2012 Document Reviewed: 01/08/2009 [facility] Patient Information 2015 [place], [place]. This information is not intended to replace advice given to you by your health care provider. Make sure you discuss any questions you have with your health care provider.

## 2015-05-14 NOTE — ED Provider Notes (Signed)
CSN: [id number]     Arrival date & time 05/14/15  1216 History   First MD Initiated Contact with Patient 05/14/15 1320     Chief Complaint  Patient presents with  . Shoulder Pain     (Consider location/radiation/quality/duration/timing/severity/associated sxs/prior Treatment) Patient is a 74 y.o. female presenting with shoulder pain. The history is provided by the patient. No language interpreter was used.  Shoulder Pain Associated symptoms: no back pain, no fever and no neck pain   Jenna Foster is a 74 year old female with past mental history of diabetes, hypertension, asthma, hyperlipidemia, cholecystectomy, sleep apnea presenting to the ED with right shoulder pain that started abruptly this morning, woke the patient out of a sound sleep. Patient reported that she normally sleeps on her back. Stated that the pain is localized to the right shoulder, at rest as a dull pain with motion as a sharp shooting pain that radiates from her right shoulder down to her right elbow. Patient reports that she has taken nothing for the pain. Reported that she does have history of weakness in her right hand secondary to carpal tunnel issues as well as rheumatoid arthritis. Denied skin color changes, warmth upon palpation, heavy lifting, injury, fall, numbness, tingling, loss of sensation, neck pain, neck stiffness, headache, dizziness, nausea, vomiting, blurred vision, sudden loss of vision, speech difficulty, confusion, disorientation, chest pain, shortness of breath, difficulty breathing, fever, chills, fainting, jaw pain, cough, hemoptysis, travels. PCP Dr. Jarold Motto  Past Medical History  Diagnosis Date  . Diabetes mellitus   . Hypertension   . Asthma   . Hyperlipidemia   . OSA (obstructive sleep apnea)     on CPAP    Past Surgical History  Procedure Laterality Date  . Cholecystectomy  1965  . Abdominal hysterectomy  1964   Family History  Problem Relation Age of Onset  . Diabetes Mother   .  Hypertension Mother   . Alzheimer's disease Mother   . Stomach cancer Sister    History  Substance Use Topics  . Smoking status: Never Smoker   . Smokeless tobacco: Never Used  . Alcohol Use: No   OB History    No data available     Review of Systems  Constitutional: Negative for fever and chills.  HENT: Negative for sore throat and trouble swallowing.   Eyes: Negative for visual disturbance.  Respiratory: Negative for shortness of breath.   Cardiovascular: Negative for chest pain.  Musculoskeletal: Positive for arthralgias (Right shoulder pain). Negative for back pain, neck pain and neck stiffness.  Neurological: Negative for dizziness, syncope, weakness, numbness and headaches.      Allergies  Bactrim and Sulfur  Home Medications   Prior to Admission medications   Medication Sig Start Date End Date Taking? Authorizing Provider  abatacept (ORENCIA) 250 MG injection Inject into the vein.    Historical Provider, MD  aspirin 81 MG EC tablet Take 81 mg by mouth daily.     Historical Provider, MD  Calcium Carbonate-Vitamin D (CALTRATE 600+D PO) Take by mouth.    Historical Provider, MD  Cyanocobalamin (VITAMIN B-12) 1000 MCG/15ML LIQD Take 10 mLs by mouth.    Historical Provider, MD  gabapentin (NEURONTIN) 300 MG capsule Take 300 mg by mouth at bedtime.    Historical Provider, MD  levothyroxine (SYNTHROID, LEVOTHROID) 50 MCG tablet TAKE 1 TABLET BY MOUTH DAILY ON AN EMPTY STOMACH 05/27/13   Historical Provider, MD  losartan-hydrochlorothiazide (HYZAAR) 100-12.5 MG per tablet Take 1 tablet by mouth daily.  Historical Provider, MD  metFORMIN (GLUCOPHAGE) 500 MG tablet Take 500 mg by mouth 2 (two) times daily with a meal.     Historical Provider, MD  metoprolol tartrate (LOPRESSOR) 25 MG tablet Take 25 mg by mouth 2 (two) times daily.     Historical Provider, MD  Multiple Vitamins-Minerals (CENTRUM SILVER PO) Take 1 tablet by mouth daily.      Historical Provider, MD    pravastatin (PRAVACHOL) 40 MG tablet Take 1 tablet by mouth daily. 03/15/14   Historical Provider, MD   BP 117/47 mmHg  Pulse 61  Temp(Src) 98.2 F (36.8 C) (Oral)  Resp 18  Ht 5\' 5"  (1.651 m)  Wt 213 lb (96.616 kg)  BMI 35.44 kg/m2  SpO2 99% Physical Exam  Constitutional: She is oriented to person, place, and time. She appears well-developed and well-nourished. No distress.  HENT:  Head: Normocephalic and atraumatic.  Mouth/Throat: Oropharynx is clear and moist. No oropharyngeal exudate.  Eyes: Conjunctivae and EOM are normal. Pupils are equal, round, and reactive to light.  Neck: Normal range of motion. Neck supple.  Mild discomfort upon palpation to the trapezius muscle  Cardiovascular: Normal rate, regular rhythm and normal heart sounds.   Pulses:      Radial pulses are 2+ on the right side, and 2+ on the left side.  Cap refill < 3 seconds  Pulmonary/Chest: Effort normal and breath sounds normal. No respiratory distress. She has no wheezes. She has no rales.  Musculoskeletal: She exhibits tenderness.       Right shoulder: She exhibits decreased range of motion (Secondary to pain) and tenderness. She exhibits no bony tenderness, no swelling, no effusion, no deformity and no laceration.       Arms: Negative swelling, erythema, inflammation, lesions, sores, deformities, malalignments, warmth upon palpation, red streaks identified to the right shoulder. Tenderness upon palpation to the posterior and anterior glenohumeral joint and tenderness upon palpation circumferentially to the right humerus. Negative crepitus upon palpation. Decreased flexion extension as well as abduction secondary to pain with motion. Negative crepitus or clicking upon motion.  Neurological: She is alert and oriented to person, place, and time. No cranial nerve deficit. She exhibits normal muscle tone. Coordination normal. GCS eye subscore is 4. GCS verbal subscore is 5. GCS motor subscore is 6.  Cranial nerves  III-XII grossly intact Strength 5+/5+ to upper extremities bilaterally with resistance applied, equal distribution noted Equal grip strength bilaterally Strength intact to MCP, PIP, DIP joints of bilateral hand Sensation intact with differentiation to sharp and dull touch Negative arm drift Fine motor skills intact  Skin: Skin is warm and dry. No rash noted. She is not diaphoretic. No erythema.  Psychiatric: She has a normal mood and affect. Her behavior is normal. Thought content normal.  Nursing note and vitals reviewed.   ED Course  Procedures (including critical care time) Labs Review Labs Reviewed  CBG MONITORING, ED - Abnormal; Notable for the following:    Glucose-Capillary 144 (*)    All other components within normal limits    Imaging Review Dg Shoulder Right  05/14/2015   CLINICAL DATA:  Right shoulder pain for 1 day with no known injury. Previous history of gout  EXAM: RIGHT SHOULDER - 2+ VIEW  COMPARISON:  07/20/2008  FINDINGS: There is no evidence of fracture or dislocation. No soft tissue mineralization. Mild spurring about the greater trochanter and acromion with narrowing of the subacromial space. Small spurs about the inferior glenohumeral joint without joint narrowing. Acromioclavicular  spurring, better seen on the previous study.  IMPRESSION: 1. No acute findings. 2. Glenohumeral and acromioclavicular osteoarthritis, stable from 2009.   Electronically Signed   By: Marnee Spring M.D.   On: 05/14/2015 12:50     EKG Interpretation None       *PRELIMINARY RESULTS* Vascular Ultrasound Right upper extremity venous duplex has been completed. Preliminary findings: negative for DVT and SVT  Farrel Demark, RDMS, RVT 05/14/2015, 3:40 PM   4:03 PM Dr. Effie Shy at bedside assessing patient. Reported the patient can be discharged home. Recommended sling immobilizer for warm compressions. Recommended referral for PCP and orthopedics.  MDM   Final diagnoses:  Right  shoulder pain  Arthritis    Medications - No data to display  Filed Vitals:   05/14/15 1221 05/14/15 1558  BP: 118/98 117/47  Pulse: 70 61  Temp: 98.2 F (36.8 C)   TempSrc: Oral   Resp: 14 18  Height: 5\' 5"  (1.651 m)   Weight: 213 lb (96.616 kg)   SpO2: 97% 99%   CBG noted 144. Right shoulder plain film no acute findings, glenohumeral and acromioclavicular osteoarthritis stable from 2009. Right upper extremity venous duplex negative for DVT or SVT. Plain film of right shoulder noted arthritis. Negative findings of phlebitis, DVT or SVT. Doubt septic joint. Patient seen and assessed by attending physician, Dr. Effie Shy, who cleared patient for discharge. Patient placed in sling immobilizer for comfort purposes. Suspicion to be arthritis resulting in her pain. Pulses palpable and strong. Negative focal neurological deficits noted. Range of motion noted, but with pain secondary to arthritis. Patient stable, afebrile. Patient not septic appearing. Negative signs of respiratory distress. Discharged patient. Referred patient to PCP and orthopedics. Discussed with patient to keep arm in sling for comfort purposes. Discussed with patient to apply warm compressions and massage. Discussed with patient to closely monitor symptoms and if symptoms are to worsen or change to report back to the ED - strict return instructions given.  Patient agreed to plan of care, understood, all questions answered.   Raymon Mutton, PA-C 05/14/15 1628  Mancel Bale, MD 05/14/15 [phone]

## 2015-05-14 NOTE — Progress Notes (Signed)
*  PRELIMINARY RESULTS* Vascular Ultrasound Right upper extremity venous duplex has been completed.  Preliminary findings: negative for DVT and SVT  Farrel Demark, RDMS, RVT  05/14/2015, 3:40 PM

## 2015-05-14 NOTE — ED Provider Notes (Signed)
  Face-to-face evaluation   History: Shoulder pain, since this morning. No known trauma. No neck pain, back pain, weakness or dizziness.  Physical exam: Left shoulder tender anteriorly, posteriorly. Decreased shoulder flexion secondary to pain.  Medical screening examination/treatment/procedure(s) were conducted as a shared visit with non-physician practitioner(s) and myself.  I personally evaluated the patient during the encounter  Mancel Bale, MD 05/14/15 1657

## 2015-05-21 DIAGNOSIS — M0579 Rheumatoid arthritis with rheumatoid factor of multiple sites without organ or systems involvement: Secondary | ICD-10-CM | POA: Diagnosis not present

## 2015-06-18 DIAGNOSIS — M0579 Rheumatoid arthritis with rheumatoid factor of multiple sites without organ or systems involvement: Secondary | ICD-10-CM | POA: Diagnosis not present

## 2015-06-18 DIAGNOSIS — M0589 Other rheumatoid arthritis with rheumatoid factor of multiple sites: Secondary | ICD-10-CM | POA: Diagnosis not present

## 2015-07-01 ENCOUNTER — Telehealth: Payer: Self-pay | Admitting: Neurology

## 2015-07-01 NOTE — Telephone Encounter (Signed)
I spoke to [place], they state they were unable to reach patient last month and will give her a call today. I tried to call patient to let her know they will contact her but her number is not a working number. Unable to leave message.

## 2015-07-01 NOTE — Telephone Encounter (Signed)
Patient is calling as [facility]  [place] sent her the wrong mask for her CPAP machine. She called them to order the proper one but she states it has been 4 weeks and she has not received her new mask.  She is asking for help on our end to get the mask.  Their phone number is [phone].  Thanks!

## 2015-07-02 NOTE — Telephone Encounter (Signed)
I spoke to patient today. She states that their phone was out for a couple of days. She reports that she wants 2 FFM for her CPAP machine. I advised her that Christoper Allegra stated that they would call her yesterday but may not have been able to get through. I suggested that she call [facility] back and let them know what she needs. I gave her their phone number and asked her to call back if she needed anything else.

## 2015-07-09 DIAGNOSIS — E119 Type 2 diabetes mellitus without complications: Secondary | ICD-10-CM | POA: Diagnosis not present

## 2015-07-13 IMAGING — CR DG LUMBAR SPINE COMPLETE 4+V
5 series · 5 of 5 positions shown · non-contrast
Comparison: None.

CLINICAL DATA: Right sided lower back pain for several months

LUMBAR SPINE - COMPLETE 4+ VIEW

[t l-spine a.p.]
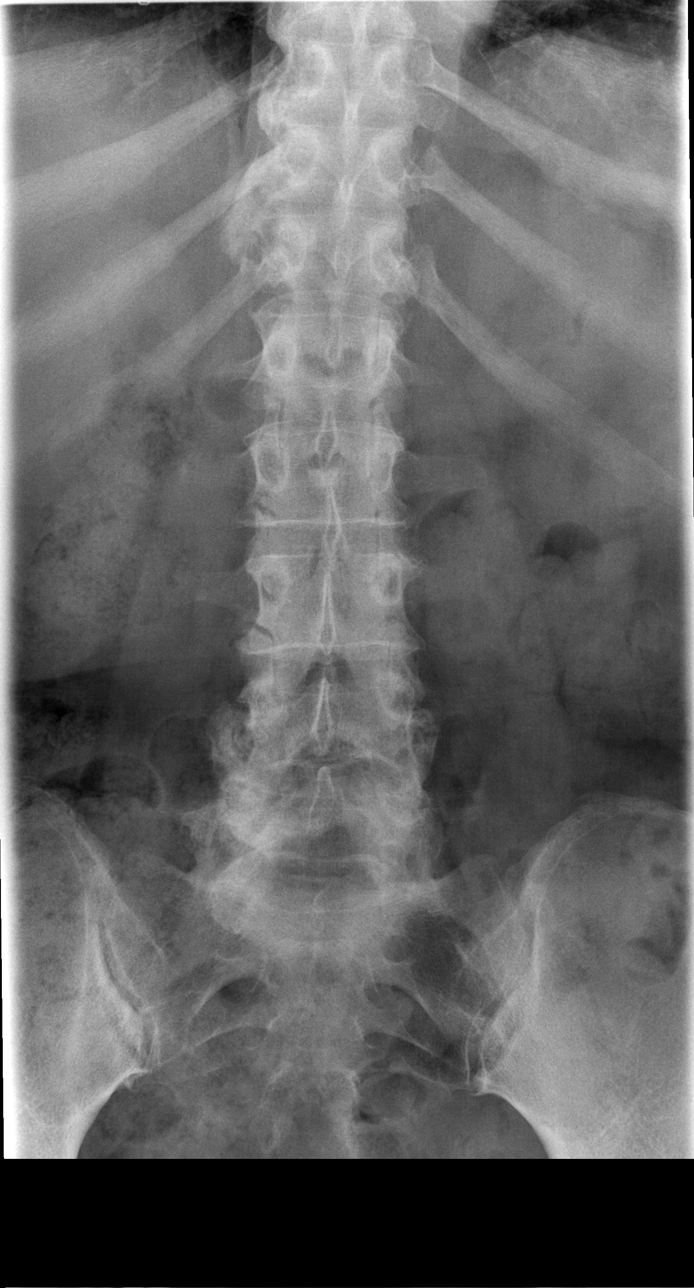

[t l-spine oblique exposure (1 of 2)]
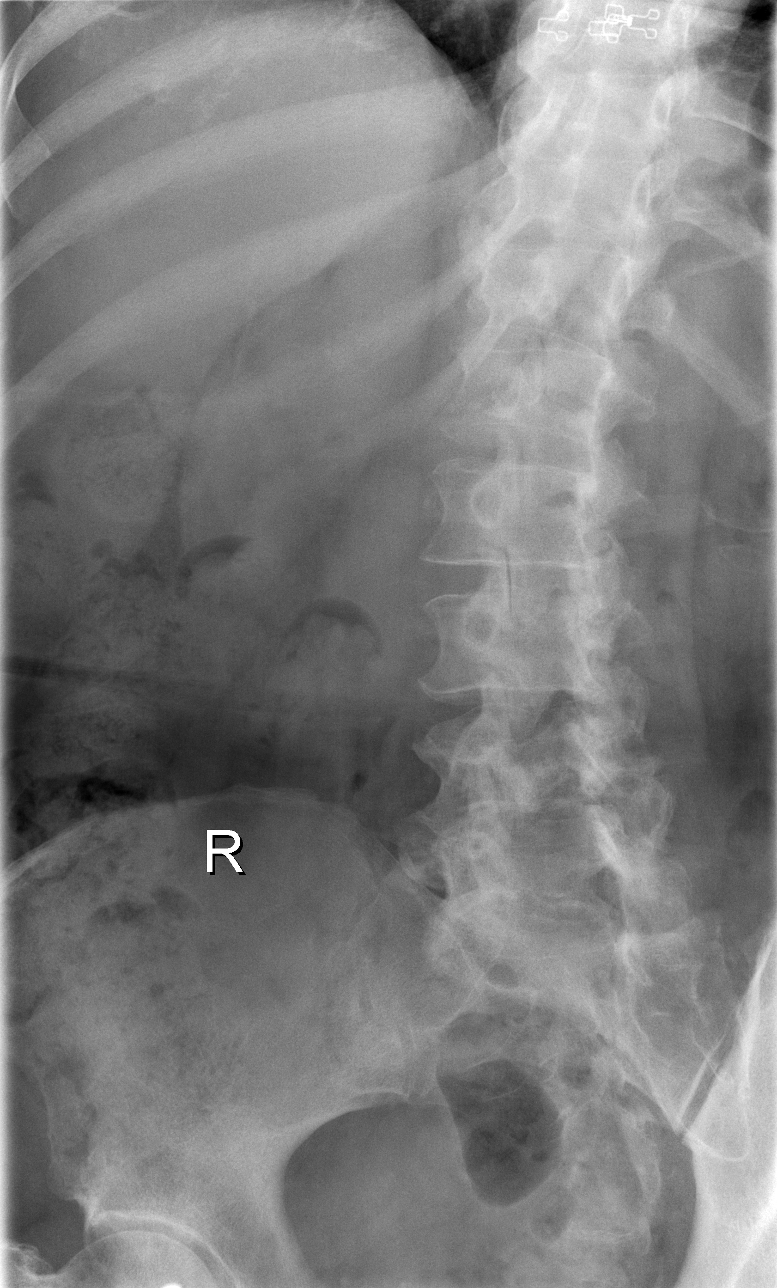

[t l-spine oblique exposure (2 of 2)]
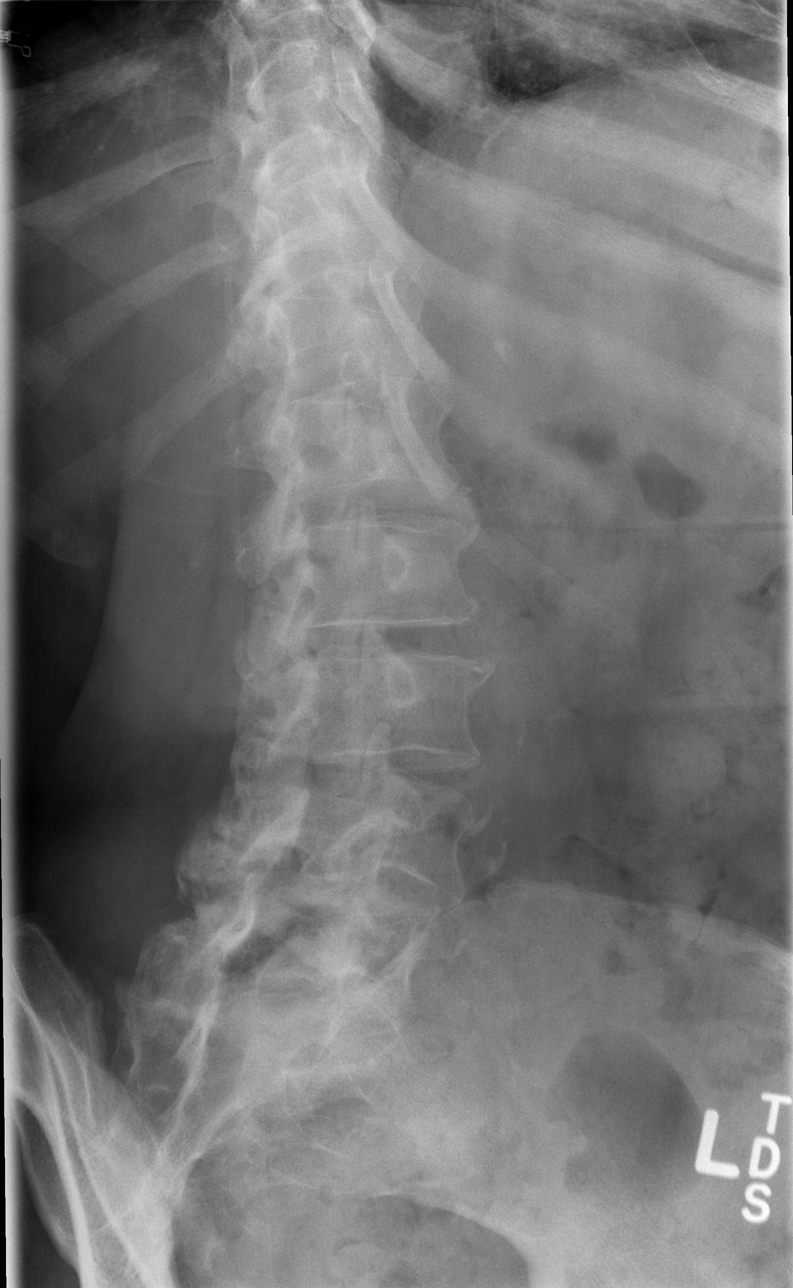

[t l-spine lat]
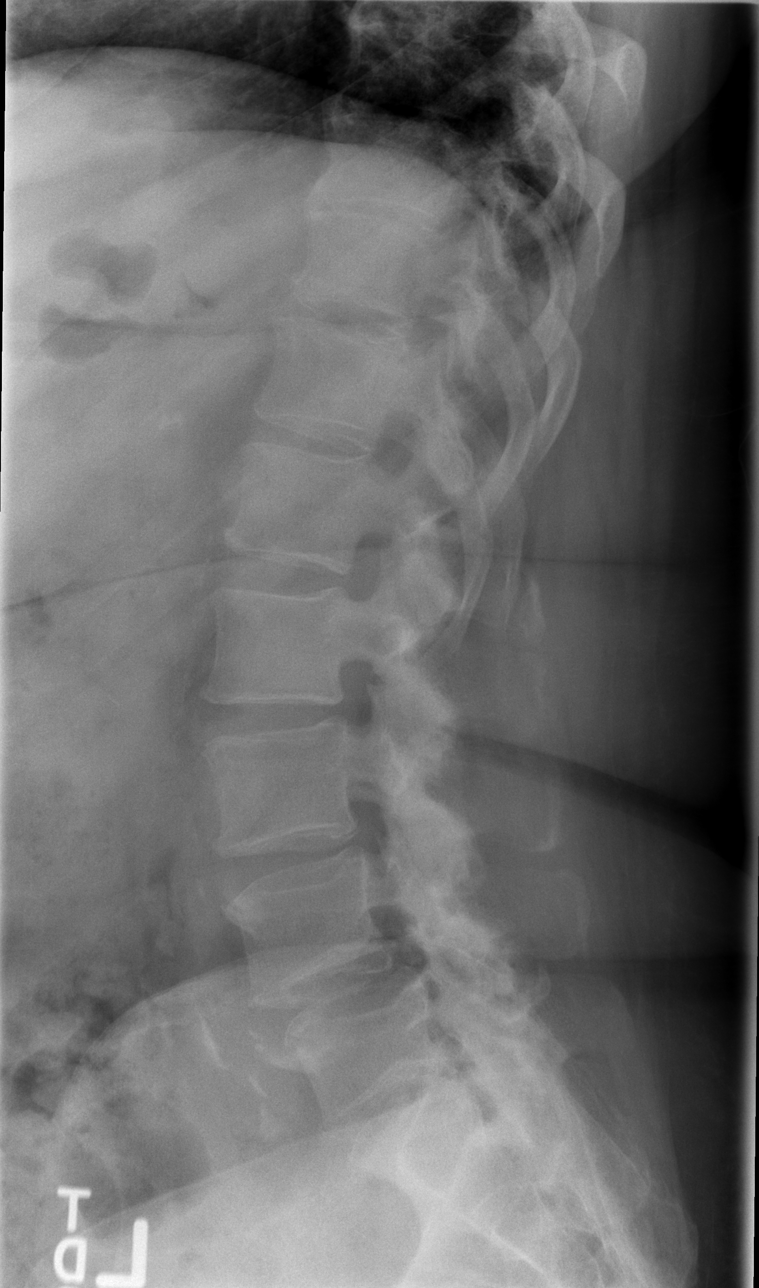

[t l-spine l5-s1 spot]
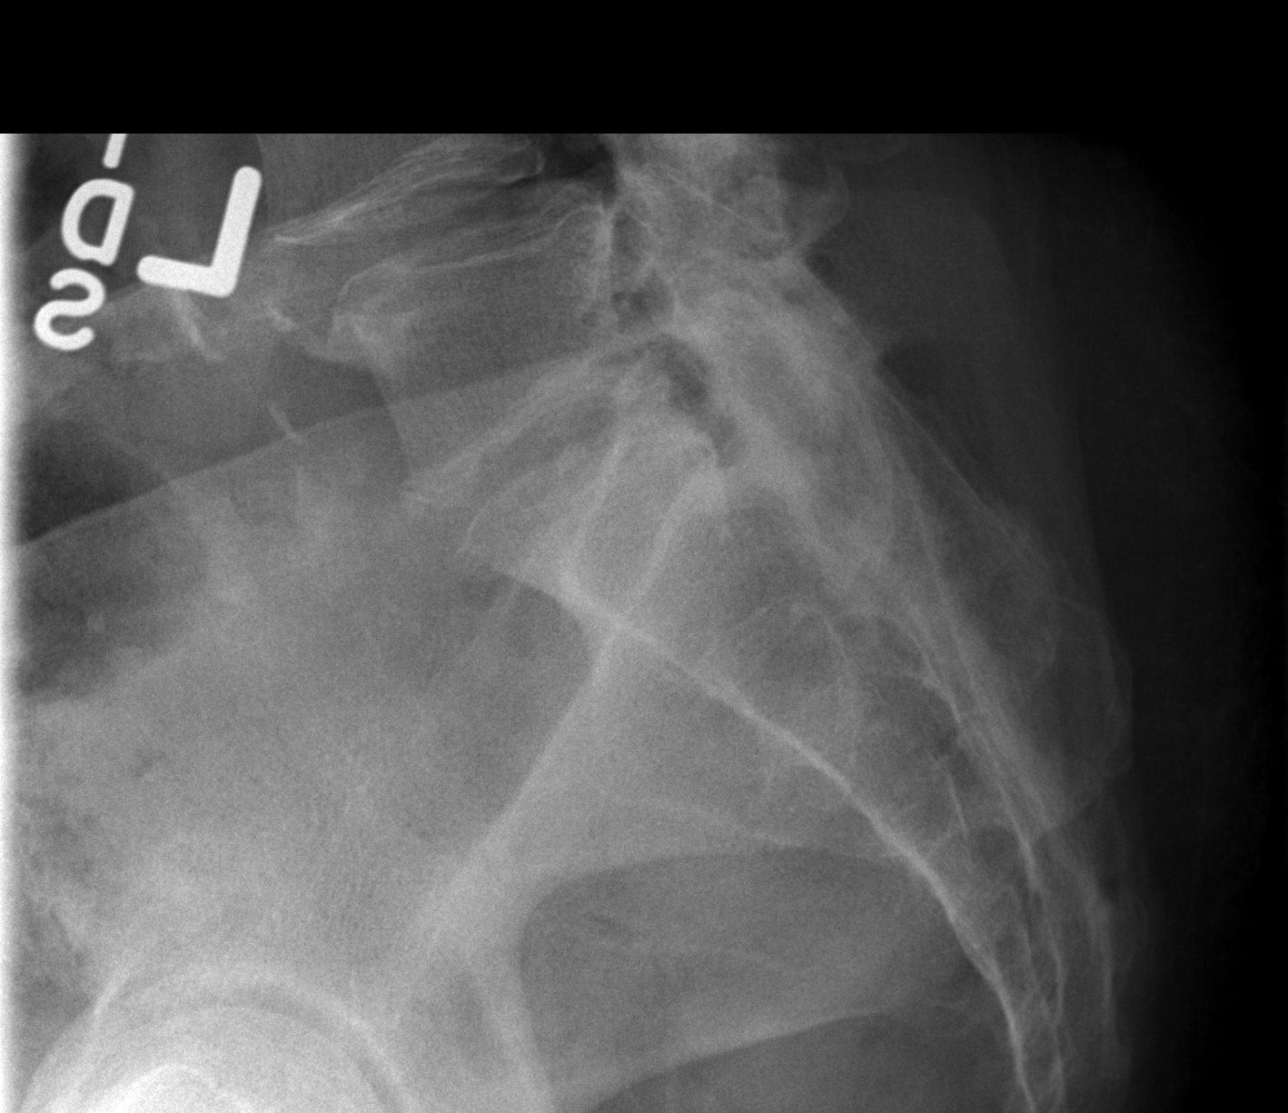

[5 of 5 positions shown; findings below may reference images not displayed]

FINDINGS: The vertebral body heights are normal.  There is no
malalignment.  There are facet joint sclerosis throughout the
lumbar spine.  Anterior osteophytosis is noted in the upper lumbar
spine and lower lumbar spine.  There is no acute fracture or
dislocation.  Bowel content is noted throughout colon.
IMPRESSION: Degenerative joint changes of lumbar spine.  No acute fracture or
dislocation is identified.

## 2015-07-15 IMAGING — US US ABDOMEN LIMITED
1 series · 14 of 25 positions shown · non-contrast
Comparison: None.

CLINICAL DATA: Abnormal liver function studies. Post
cholecystectomy.

EXAM:
US ABDOMEN LIMITED - RIGHT UPPER QUADRANT

[Series 1: us abdomen limited · 0.28mm/px · 14 of 33 slices shown]
[im 1/33]
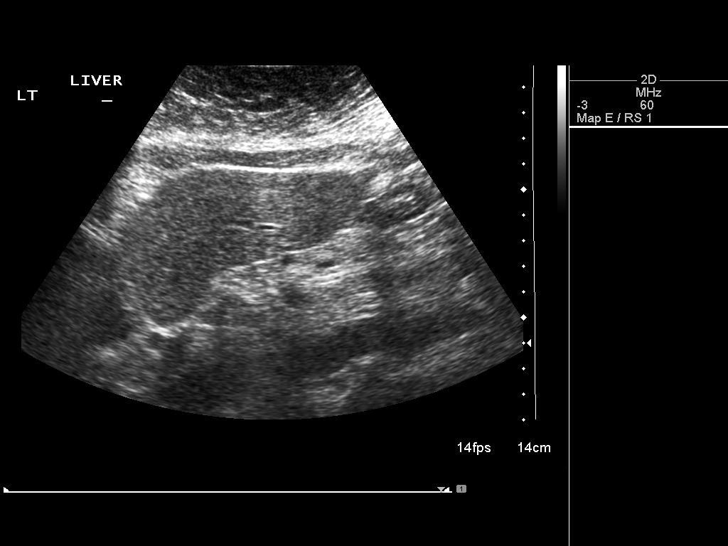
[im 3/33]
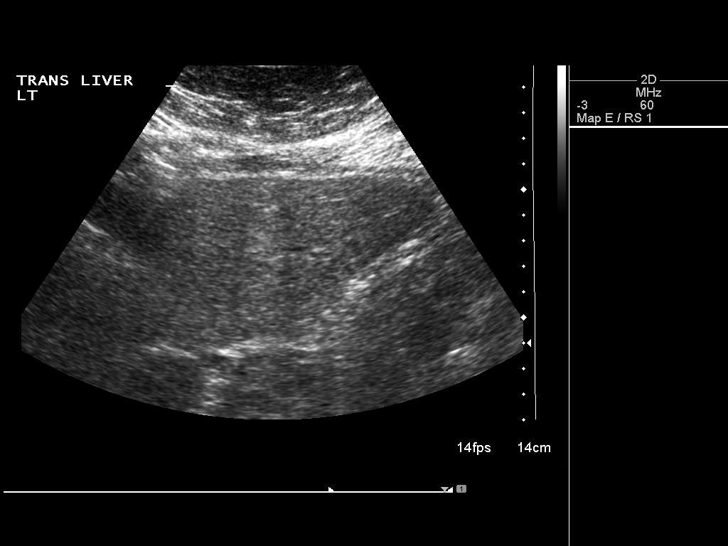
[im 6/33]
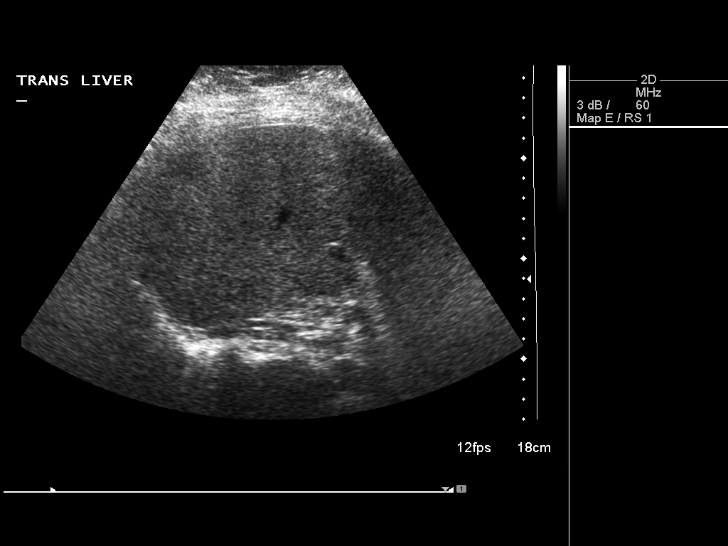
[im 9/33]
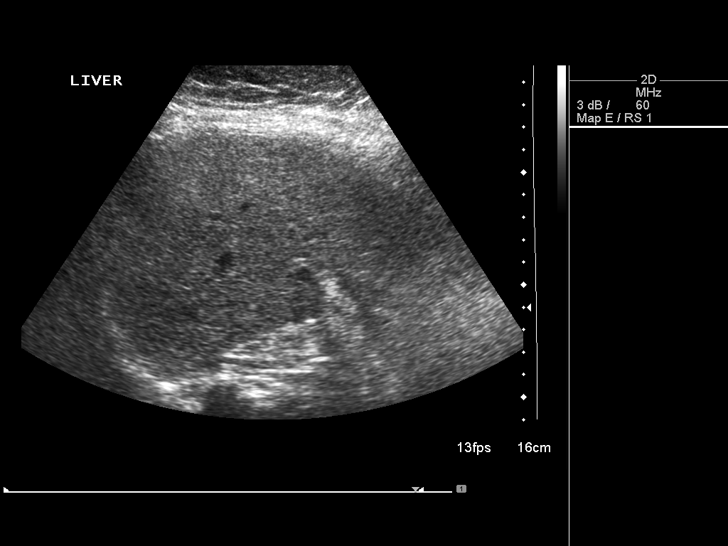
[im 11/33]
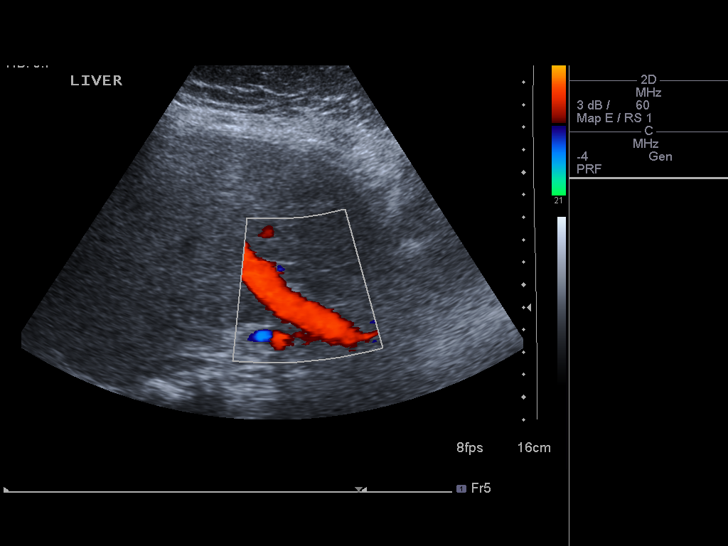
[im 13/33]
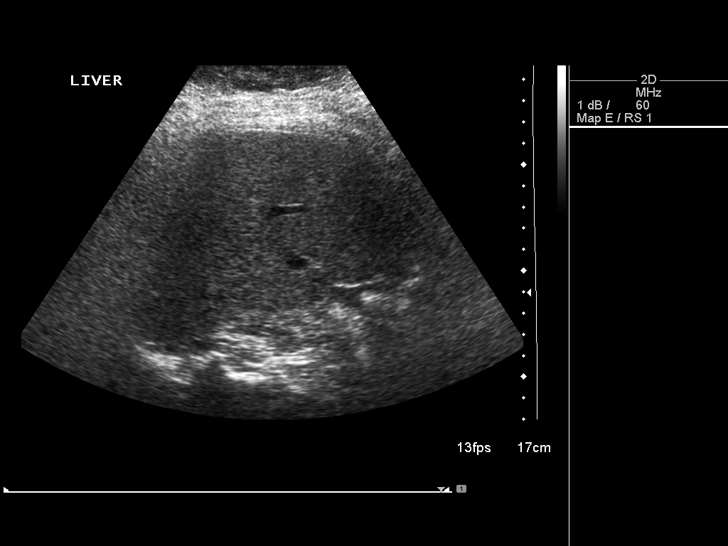
[im 15/33]
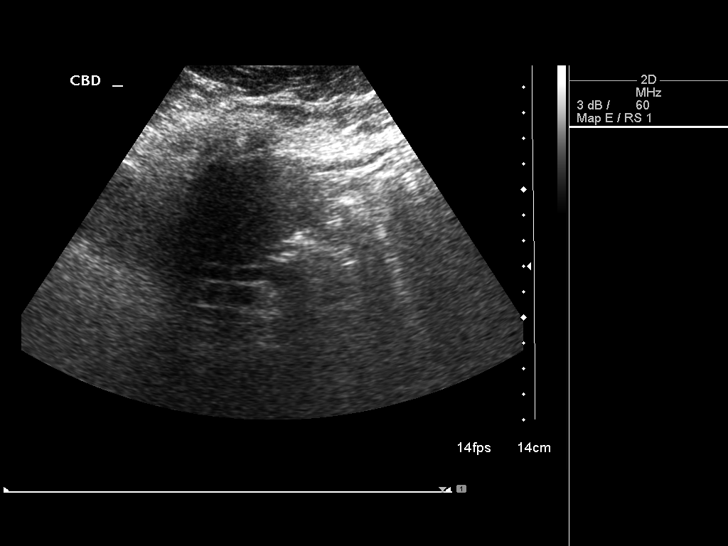
[im 18/33]
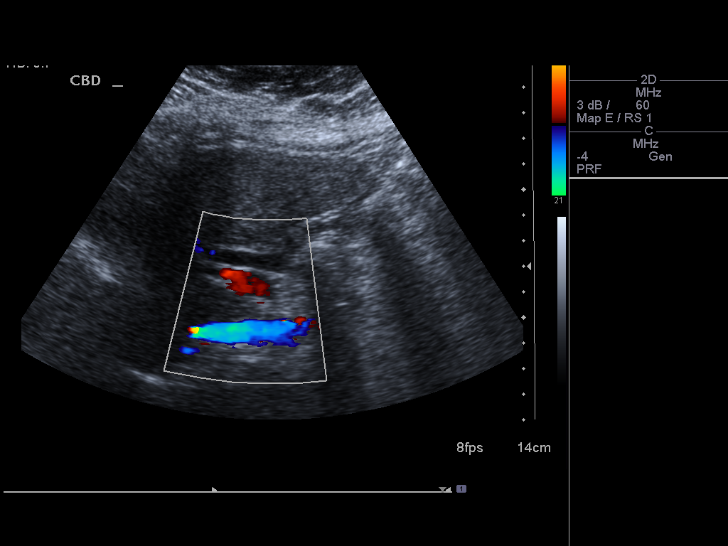
[im 21/33]
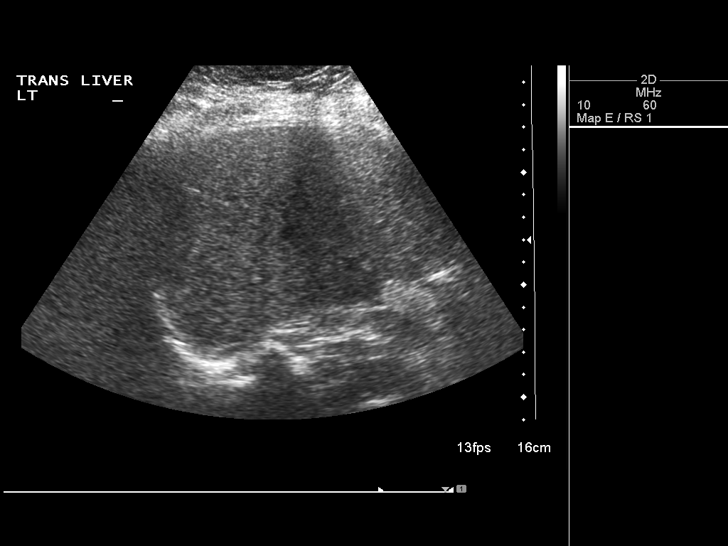
[im 22/33]
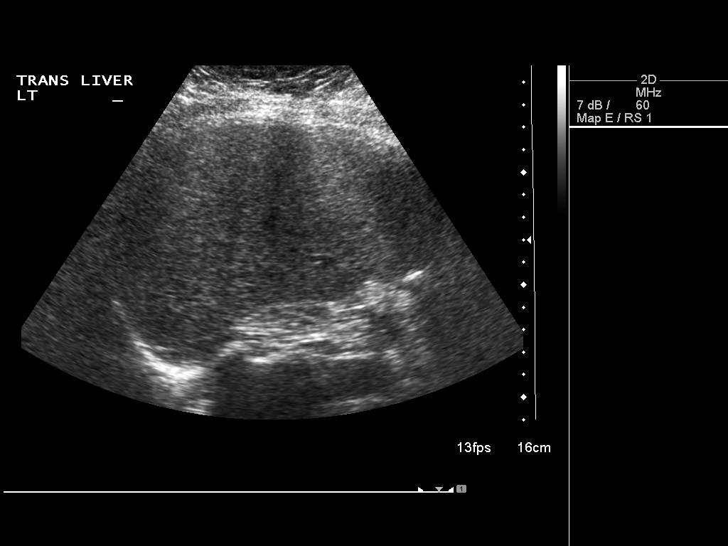
[im 25/33]
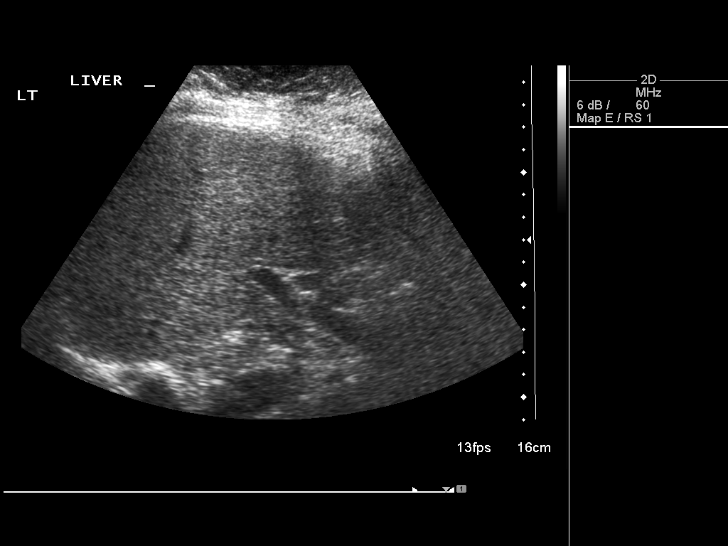
[im 27/33]
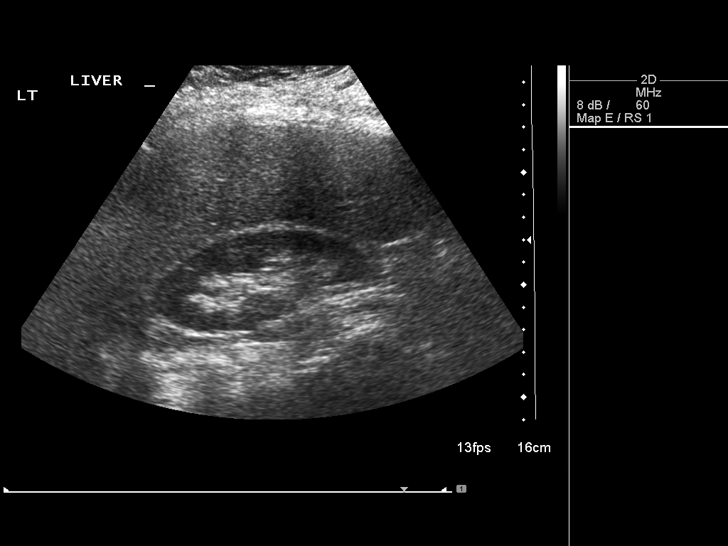
[im 30/33]
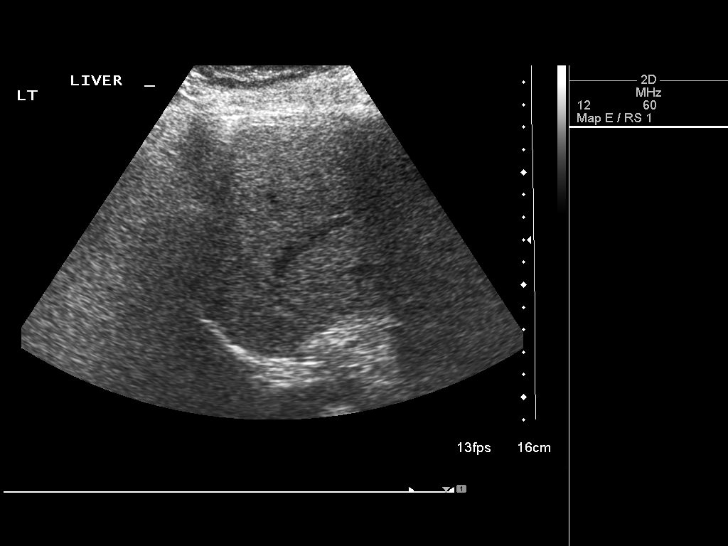
[im 33/33]
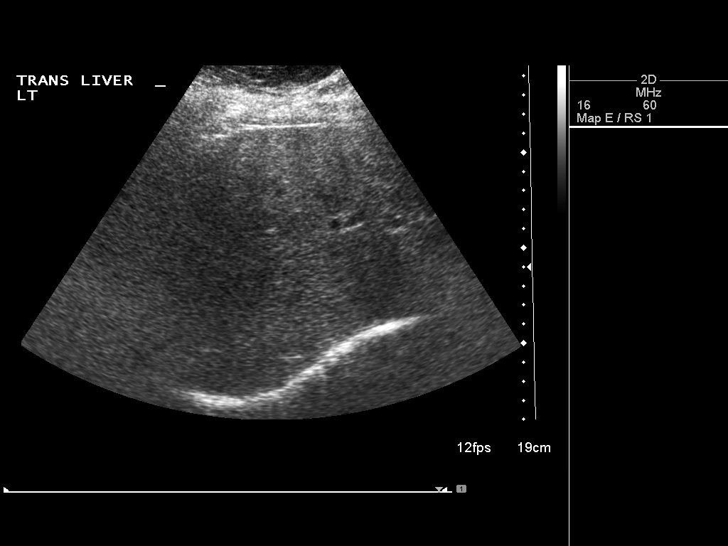

[14 of 25 positions shown; findings below may reference images not displayed]

FINDINGS: Gallbladder:

Surgically removed

Common bile duct

Diameter: 5.9 mm. The distal aspect is poorly delineated secondary
to overlying bowel gas.

Liver:

Mild diffuse increased echogenicity consistent with fatty
infiltration. No focal mass or intrahepatic biliary duct dilation
noted.
IMPRESSION: Fatty liver.

Post cholecystectomy.

Distal aspect of the common bile duct not visualized secondary to
bowel gas. Proximal aspect measures 5.9 mm which is within the range
of normal limits in a patient of this age who is post
cholecystectomy

## 2015-07-18 DIAGNOSIS — M0579 Rheumatoid arthritis with rheumatoid factor of multiple sites without organ or systems involvement: Secondary | ICD-10-CM | POA: Diagnosis not present

## 2015-07-18 DIAGNOSIS — R7612 Nonspecific reaction to cell mediated immunity measurement of gamma interferon antigen response without active tuberculosis: Secondary | ICD-10-CM | POA: Diagnosis not present

## 2015-07-18 DIAGNOSIS — R5383 Other fatigue: Secondary | ICD-10-CM | POA: Diagnosis not present

## 2015-07-28 DIAGNOSIS — I739 Peripheral vascular disease, unspecified: Secondary | ICD-10-CM | POA: Diagnosis not present

## 2015-07-28 DIAGNOSIS — I10 Essential (primary) hypertension: Secondary | ICD-10-CM | POA: Diagnosis not present

## 2015-07-28 DIAGNOSIS — Z6834 Body mass index (BMI) 34.0-34.9, adult: Secondary | ICD-10-CM | POA: Diagnosis not present

## 2015-07-28 DIAGNOSIS — E1149 Type 2 diabetes mellitus with other diabetic neurological complication: Secondary | ICD-10-CM | POA: Diagnosis not present

## 2015-07-28 DIAGNOSIS — G4733 Obstructive sleep apnea (adult) (pediatric): Secondary | ICD-10-CM | POA: Diagnosis not present

## 2015-07-28 DIAGNOSIS — E1140 Type 2 diabetes mellitus with diabetic neuropathy, unspecified: Secondary | ICD-10-CM | POA: Diagnosis not present

## 2015-07-28 DIAGNOSIS — Z23 Encounter for immunization: Secondary | ICD-10-CM | POA: Diagnosis not present

## 2015-08-08 ENCOUNTER — Ambulatory Visit: Payer: Medicare Other | Admitting: Neurology

## 2015-08-12 ENCOUNTER — Encounter (HOSPITAL_COMMUNITY): Payer: Self-pay | Admitting: Emergency Medicine

## 2015-08-12 ENCOUNTER — Emergency Department (HOSPITAL_COMMUNITY)
Admission: EM | Admit: 2015-08-12 | Discharge: 2015-08-12 | Disposition: A | Discharge status: Home / Self Care | Payer: Medicare Other | Attending: Emergency Medicine | Admitting: Emergency Medicine

## 2015-08-12 ENCOUNTER — Emergency Department (HOSPITAL_COMMUNITY): Payer: Medicare Other

## 2015-08-12 DIAGNOSIS — E785 Hyperlipidemia, unspecified: Secondary | ICD-10-CM | POA: Diagnosis not present

## 2015-08-12 DIAGNOSIS — J45909 Unspecified asthma, uncomplicated: Secondary | ICD-10-CM | POA: Diagnosis not present

## 2015-08-12 DIAGNOSIS — E119 Type 2 diabetes mellitus without complications: Secondary | ICD-10-CM | POA: Insufficient documentation

## 2015-08-12 DIAGNOSIS — Z79899 Other long term (current) drug therapy: Secondary | ICD-10-CM | POA: Diagnosis not present

## 2015-08-12 DIAGNOSIS — R109 Unspecified abdominal pain: Secondary | ICD-10-CM | POA: Diagnosis present

## 2015-08-12 DIAGNOSIS — G4733 Obstructive sleep apnea (adult) (pediatric): Secondary | ICD-10-CM | POA: Diagnosis not present

## 2015-08-12 DIAGNOSIS — Z7982 Long term (current) use of aspirin: Secondary | ICD-10-CM | POA: Diagnosis not present

## 2015-08-12 DIAGNOSIS — Z9049 Acquired absence of other specified parts of digestive tract: Secondary | ICD-10-CM | POA: Diagnosis not present

## 2015-08-12 DIAGNOSIS — K6389 Other specified diseases of intestine: Secondary | ICD-10-CM | POA: Diagnosis not present

## 2015-08-12 DIAGNOSIS — I10 Essential (primary) hypertension: Secondary | ICD-10-CM | POA: Insufficient documentation

## 2015-08-12 DIAGNOSIS — Z90710 Acquired absence of both cervix and uterus: Secondary | ICD-10-CM | POA: Insufficient documentation

## 2015-08-12 DIAGNOSIS — K529 Noninfective gastroenteritis and colitis, unspecified: Secondary | ICD-10-CM | POA: Insufficient documentation

## 2015-08-12 LAB — CBC WITH DIFFERENTIAL/PLATELET
Basophils Absolute: 0 10*3/uL (ref 0.0–0.1)
Basophils Relative: 0 % (ref 0–1)
Eosinophils Absolute: 0.3 10*3/uL (ref 0.0–0.7)
Eosinophils Relative: 4 % (ref 0–5)
HCT: 37.5 % (ref 36.0–46.0)
Hemoglobin: 12 g/dL (ref 12.0–15.0)
Lymphocytes Relative: 31 % (ref 12–46)
Lymphs Abs: 2.2 10*3/uL (ref 0.7–4.0)
MCH: 27.1 pg (ref 26.0–34.0)
MCHC: 32 g/dL (ref 30.0–36.0)
MCV: 84.7 fL (ref 78.0–100.0)
Monocytes Absolute: 0.5 10*3/uL (ref 0.1–1.0)
Monocytes Relative: 8 % (ref 3–12)
Neutro Abs: 4.2 10*3/uL (ref 1.7–7.7)
Neutrophils Relative %: 58 % (ref 43–77)
Platelets: 157 10*3/uL (ref 150–400)
RBC: 4.43 MIL/uL (ref 3.87–5.11)
RDW: 15.6 % — ABNORMAL HIGH (ref 11.5–15.5)
WBC: 7.2 10*3/uL (ref 4.0–10.5)

## 2015-08-12 LAB — COMPREHENSIVE METABOLIC PANEL
ALT: 41 U/L (ref 14–54)
AST: 82 U/L — ABNORMAL HIGH (ref 15–41)
Albumin: 3.2 g/dL — ABNORMAL LOW (ref 3.5–5.0)
Alkaline Phosphatase: 75 U/L (ref 38–126)
Anion gap: 9 (ref 5–15)
BUN: 8 mg/dL (ref 6–20)
CO2: 29 mmol/L (ref 22–32)
Calcium: 9.7 mg/dL (ref 8.9–10.3)
Chloride: 100 mmol/L — ABNORMAL LOW (ref 101–111)
Creatinine, Ser: 0.77 mg/dL (ref 0.44–1.00)
GFR calc Af Amer: >60 mL/min (ref >60)
GFR calc non Af Amer: >60 mL/min (ref >60)
Glucose, Bld: 115 mg/dL — ABNORMAL HIGH (ref 65–99)
Potassium: 4.4 mmol/L (ref 3.5–5.1)
Sodium: 138 mmol/L (ref 135–145)
Total Bilirubin: 1.2 mg/dL (ref 0.3–1.2)
Total Protein: 7.4 g/dL (ref 6.5–8.1)

## 2015-08-12 LAB — URINALYSIS, ROUTINE W REFLEX MICROSCOPIC
Bilirubin Urine: NEGATIVE
Glucose, UA: NEGATIVE mg/dL
Hgb urine dipstick: NEGATIVE
Ketones, ur: NEGATIVE mg/dL
Nitrite: NEGATIVE
Protein, ur: NEGATIVE mg/dL
Specific Gravity, Urine: 1.011 (ref 1.005–1.030)
Urobilinogen, UA: 1 mg/dL (ref 0.0–1.0)
pH: 7 (ref 5.0–8.0)

## 2015-08-12 LAB — URINE MICROSCOPIC-ADD ON

## 2015-08-12 MED ORDER — OXYCODONE-ACETAMINOPHEN 5-325 MG PO TABS: 1.0000 | ORAL_TABLET | ORAL | Status: DC | PRN

## 2015-08-12 MED ORDER — CIPROFLOXACIN HCL 500 MG PO TABS: 500.0000 mg | ORAL_TABLET | Freq: Two times a day (BID) | ORAL | Status: DC

## 2015-08-12 MED ORDER — METRONIDAZOLE 500 MG PO TABS: 500.0000 mg | ORAL_TABLET | Freq: Three times a day (TID) | ORAL | Status: DC

## 2015-08-12 MED ORDER — MORPHINE SULFATE (PF) 4 MG/ML IV SOLN
6.0000 mg | Freq: Once | INTRAVENOUS | Status: AC
  Administered 2015-08-12: 6 mg via INTRAVENOUS
  Filled 2015-08-12: qty 2

## 2015-08-12 MED ORDER — CIPROFLOXACIN IN D5W 400 MG/200ML IV SOLN
400.0000 mg | Freq: Once | INTRAVENOUS | Status: AC
  Administered 2015-08-12: 400 mg via INTRAVENOUS
  Filled 2015-08-12: qty 200

## 2015-08-12 MED ORDER — IOHEXOL 300 MG/ML  SOLN
100.0000 mL | Freq: Once | INTRAMUSCULAR | Status: AC | PRN
  Administered 2015-08-12: 100 mL via INTRAVENOUS

## 2015-08-12 MED ORDER — IOHEXOL 300 MG/ML  SOLN
25.0000 mL | Freq: Once | INTRAMUSCULAR | Status: AC | PRN
  Administered 2015-08-12: 25 mL via ORAL

## 2015-08-12 MED ORDER — METRONIDAZOLE IN NACL 5-0.79 MG/ML-% IV SOLN
500.0000 mg | Freq: Once | INTRAVENOUS | Status: AC
  Administered 2015-08-12: 500 mg via INTRAVENOUS
  Filled 2015-08-12: qty 100

## 2015-08-12 NOTE — Discharge Instructions (Signed)

## 2015-08-12 NOTE — ED Notes (Signed)
PT states has right sided abdominal pain for several day getting worse; no gallbladder but appendix still. No nausea, vomiting, or diarr. Denies fevers. Painful to palpation. Pain worse with deep inspiration. Nothing makes it better.

## 2015-08-12 NOTE — ED Provider Notes (Signed)
CSN: [id number]     Arrival date & time 08/12/15  6314 History   First MD Initiated Contact with Patient 08/12/15 1117     Chief Complaint  Patient presents with  . Abdominal Pain     (Consider location/radiation/quality/duration/timing/severity/associated sxs/prior Treatment) HPI   74 year old female with abdominal pain. Gradual onset Sunday evening. Progressively worsening since then. Pain is in the right lower quadrant to right flank. Constant. Worse with certain movements. Also worse with deep inspiration. No respiratory complaints otherwise. No nausea or vomiting. No urinary complaints. No diarrhea. No history similar type symptoms. Surgical history significant for cholecystectomy and abdominal hysterectomy.  Past Medical History  Diagnosis Date  . Diabetes mellitus   . Hypertension   . Asthma   . Hyperlipidemia   . OSA (obstructive sleep apnea)     on CPAP    Past Surgical History  Procedure Laterality Date  . Cholecystectomy  1965  . Abdominal hysterectomy  1964   Family History  Problem Relation Age of Onset  . Diabetes Mother   . Hypertension Mother   . Alzheimer's disease Mother   . Stomach cancer Sister    Social History  Substance Use Topics  . Smoking status: Never Smoker   . Smokeless tobacco: Never Used  . Alcohol Use: No   OB History    No data available     Review of Systems  All systems reviewed and negative, other than as noted in HPI.   Allergies  Bactrim and Sulfur  Home Medications   Prior to Admission medications   Medication Sig Start Date End Date Taking? Authorizing Provider  abatacept (ORENCIA) 250 MG injection Inject 250 mg into the vein every 7 (seven) weeks.    Yes Historical Provider, MD  aspirin 81 MG EC tablet Take 81 mg by mouth daily.    Yes Historical Provider, MD  Calcium Carbonate-Vitamin D (CALTRATE 600+D PO) Take 1 tablet by mouth daily.    Yes Historical Provider, MD  Cyanocobalamin (VITAMIN B-12) 1000 MCG/15ML LIQD  Take 10 mLs by mouth daily.    Yes Historical Provider, MD  donepezil (ARICEPT) 5 MG tablet Take 5 mg by mouth every evening. 06/03/15  Yes Historical Provider, MD  gabapentin (NEURONTIN) 300 MG capsule Take 300 mg by mouth at bedtime.   Yes Historical Provider, MD  levothyroxine (SYNTHROID, LEVOTHROID) 50 MCG tablet TAKE 1 TABLET BY MOUTH DAILY ON AN EMPTY STOMACH 05/27/13  Yes Historical Provider, MD  losartan-hydrochlorothiazide (HYZAAR) 100-12.5 MG per tablet Take 1 tablet by mouth daily.    Yes Historical Provider, MD  metFORMIN (GLUCOPHAGE) 500 MG tablet Take 500 mg by mouth 2 (two) times daily with a meal.    Yes Historical Provider, MD  metFORMIN (GLUCOPHAGE-XR) 500 MG 24 hr tablet Take 500 mg by mouth 2 (two) times daily. 07/20/15  Yes Historical Provider, MD  metoprolol tartrate (LOPRESSOR) 25 MG tablet Take 25 mg by mouth daily.    Yes Historical Provider, MD  Multiple Vitamins-Minerals (CENTRUM SILVER PO) Take 1 tablet by mouth daily.     Yes Historical Provider, MD  pravastatin (PRAVACHOL) 40 MG tablet Take 1 tablet by mouth daily. 03/15/14  Yes Historical Provider, MD   BP 127/59 mmHg  Pulse 73  Temp(Src) 98 F (36.7 C) (Oral)  Resp 16  SpO2 97% Physical Exam  Constitutional: She appears well-developed and well-nourished. No distress.  HENT:  Head: Normocephalic and atraumatic.  Eyes: Conjunctivae are normal. Right eye exhibits no discharge. Left eye  exhibits no discharge.  Neck: Neck supple.  Cardiovascular: Normal rate, regular rhythm and normal heart sounds.  Exam reveals no gallop and no friction rub.   No murmur heard. Pulmonary/Chest: Effort normal and breath sounds normal. No respiratory distress.  Abdominal: Soft. She exhibits no distension. There is tenderness.  Moderate tenderness in the right lower quadrant into the right flank. Voluntary guarding with deep palpation. No rebound. No distention. No CVA tenderness.  Musculoskeletal: She exhibits no edema or tenderness.   Neurological: She is alert.  Skin: Skin is warm and dry.  Psychiatric: She has a normal mood and affect. Her behavior is normal. Thought content normal.  Nursing note and vitals reviewed.   ED Course  Procedures (including critical care time) Labs Review Labs Reviewed  CBC WITH DIFFERENTIAL/PLATELET - Abnormal; Notable for the following:    RDW 15.6 (*)    All other components within normal limits  URINALYSIS, ROUTINE W REFLEX MICROSCOPIC (NOT AT [facility]) - Abnormal; Notable for the following:    Leukocytes, UA MODERATE (*)    All other components within normal limits  COMPREHENSIVE METABOLIC PANEL - Abnormal; Notable for the following:    Chloride 100 (*)    Glucose, Bld 115 (*)    Albumin 3.2 (*)    AST 82 (*)    All other components within normal limits  URINE MICROSCOPIC-ADD ON - Abnormal; Notable for the following:    Squamous Epithelial / LPF FEW (*)    Bacteria, UA FEW (*)    All other components within normal limits    Imaging Review Ct Abdomen Pelvis W Contrast  08/12/2015   CLINICAL DATA:  Right lower quadrant pain  EXAM: CT ABDOMEN AND PELVIS WITH CONTRAST  TECHNIQUE: Multidetector CT imaging of the abdomen and pelvis was performed using the standard protocol following bolus administration of intravenous contrast.  CONTRAST:  OMNIPAQUE IOHEXOL 300 MG/ML  SOLN  COMPARISON:  None.  FINDINGS: Lung bases are unremarkable. There are streaky artifacts from patient's large body habitus. Sagittal images of the spine shows degenerative changes thoracolumbar spine.  Mild fatty infiltration of the liver. There is intrahepatic biliary ductal dilatation CBD measures 1.2 cm in diameter.  The pancreas, spleen and adrenal glands are unremarkable. Atherosclerotic calcifications of abdominal aorta and iliac arteries.  No aortic aneurysm.  No small bowel obstruction. No ascites or free air. No adenopathy. Kidneys are symmetrical in size and enhancement. No hydronephrosis or hydroureter.  Delayed renal images shows bilateral renal symmetrical excretion. There is small amount of ascites in right paracolic gutter adjacent to inferior aspect of the liver. There is mild focal thickening of colonic wall in right colon see axial image 45. Findings are highly suspicious for focal colitis, diverticulitis or panniculitis. Less likely a neoplastic process. Follow-up colonoscopy after treatment is recommended to assure resolution. Moderate stool noted within cecum. The terminal ileum is unremarkable. There is a normal appendix.  Status post hysterectomy. No distal colonic obstruction. No destructive bony lesions are noted within pelvis.  IMPRESSION: 1. Inflammatory process in right flank with focal thickening of right colon wall and mild pericolonic stranding and small amount free fluid in right flank. Findings highly suspicious for focal colitis or diverticulitis. A neoplastic process cannot be entirely excluded. Follow-up colonoscopy after appropriate treatment is recommended. 2. No evidence of perforation. 3. No hydronephrosis or hydroureter. Delayed renal images shows bilateral renal symmetrical excretion. 4. There is mild CBD dilatation.  Status postcholecystectomy. 5. Status post hysterectomy. These results were called  by telephone at the time of interpretation on 08/12/2015 at 3:51 pm to Dr. Raeford Razor , who verbally acknowledged these results.   Electronically Signed   By: Natasha Mead M.D.   On: 08/12/2015 15:51   I have personally reviewed and evaluated these images and lab results as part of my medical decision-making.   EKG Interpretation None      MDM   Final diagnoses:  Colitis    74 year old female with right-sided abdominal pain. CT is significant for focal inflammation of R colon. Cannot exclude underlying mass. Will treat for possible infectious colitis. Discussed with patient the need to follow-up with gastroenterology for further evaluation. Patient has previously been seen by  Dr. Loreta Ave.    Raeford Razor, MD 08/21/15 [phone]

## 2015-08-12 NOTE — ED Notes (Signed)
IV attempt x2 unsuccessful, IV team consult placed.

## 2015-08-18 ENCOUNTER — Encounter: Payer: Self-pay | Admitting: Neurology

## 2015-08-18 ENCOUNTER — Ambulatory Visit (INDEPENDENT_AMBULATORY_CARE_PROVIDER_SITE_OTHER): Payer: Medicare Other | Admitting: Neurology

## 2015-08-18 VITALS — BP 130/58 | HR 68 | Resp 16 | Ht 66.0 in | Wt 203.0 lb

## 2015-08-18 DIAGNOSIS — K529 Noninfective gastroenteritis and colitis, unspecified: Secondary | ICD-10-CM | POA: Diagnosis not present

## 2015-08-18 DIAGNOSIS — Z9989 Dependence on other enabling machines and devices: Principal | ICD-10-CM

## 2015-08-18 DIAGNOSIS — M7989 Other specified soft tissue disorders: Secondary | ICD-10-CM

## 2015-08-18 DIAGNOSIS — G4733 Obstructive sleep apnea (adult) (pediatric): Secondary | ICD-10-CM

## 2015-08-18 DIAGNOSIS — E0842 Diabetes mellitus due to underlying condition with diabetic polyneuropathy: Secondary | ICD-10-CM

## 2015-08-18 DIAGNOSIS — R413 Other amnesia: Secondary | ICD-10-CM

## 2015-08-18 NOTE — Patient Instructions (Signed)
Please continue using your CPAP regularly. While your insurance requires that you use CPAP at least 4 hours each night on 70% of the nights, I recommend, that you not skip any nights and use it throughout the night if you can. Getting used to CPAP and staying with the treatment long term does take time and patience and discipline. Untreated obstructive sleep apnea when it is moderate to severe can have an adverse impact on cardiovascular health and raise her risk for heart disease, arrhythmias, hypertension, congestive heart failure, stroke and diabetes. Untreated obstructive sleep apnea causes sleep disruption, nonrestorative sleep, and sleep deprivation. This can have an impact on your day to day functioning and cause daytime sleepiness and impairment of cognitive function, memory loss, mood disturbance, and problems focussing. Using CPAP regularly can improve these symptoms.  Keep up the good work! I will see you back in 6 months for sleep apnea check up and memory check.

## 2015-08-18 NOTE — Progress Notes (Signed)
Subjective:    Patient ID: Jenna Foster is a 74 y.o. female.  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 The patient is unaccompanied today. I last saw her on 02/05/2015, at which time she reported some residual daytime fatigue. She had started a new medication, Orencia, for her rheumatoid arthritis, in lieu of Remicade. She was following with Dr. Ouida Sills. She liked her new CPAP machine and had adjusted well to treatment and actually felt better. She was compliant with treatment and her AHI was less than 5 per hour.   Today, 08/18/2015: I reviewed her CPAP compliance data from 07/14/2015 through 08/12/2015 which is a total of 30 days during which time she used her machine every night with percent used days greater than 4 hours at 100%, indicating superb compliance with an average usage of 7 hours and 28 minutes, residual AHI 3 per hour, leak low with the 95th percentile at 9.3 L/m on a pressure of 12 cm with EPR of 2.   Today, 08/18/2015: She reports that she needs new supplies. Her DME is [facility]. She had to go to the ER unfortunately last week on 08/12/15 for abdominal pain and was diagnosed with colitis and give a 7 day course of flagyl 500 mg tid, she has about 1 day left. She has seen Dr. Collene Mares in the past and needs a FU appointment, which she has yet to make. No blood in the stool reported. She feels improved as far as her colitis is concerned. She feels fine with respect her sleep apnea and has adjusted well to treatment.  Previously:   I first met her on 09/02/2014 at the request of her primary care physician, at which time the  patient reported a prior diagnosis of obstructive sleep apnea several years ago maybe as many as 10 years prior. She was given a CPAP machine and was compliant with treatment. Her machine was over 74 years old and she had not had any reevaluation of her sleep apnea and years. I invited her back for sleep study and she had a split-night sleep study on  11/18/2014. Her baseline sleep efficiency was severely reduced at 35.7% with a latency to sleep prolonged at 126.5 minutes and wake after sleep onset of 19.5 minutes with mild sleep fragmentation noted. She had an arousal index of 14.1 per hour. She had an increased percentage of stage II sleep and absence of slow-wave sleep and REM sleep prior to CPAP initiation. She had no significant PLMS or EKG changes. She had mild to moderate snoring. She had a total AHI of 35.6 per hour. Baseline oxygen saturation was 96% with a nadir of 84%. She was then titrated on CPAP. Sleep efficiency was 68.9%. She was titrated on CPAP from 5-12 cm with an AHI of 2.9 at the final pressure. Supine REM sleep was achieved. Average oxygen saturation was 96% with a nadir of 86%. Based on the test results are prescribed a new CPAP machine for her.  I reviewed her compliance data from 01/05/2015 through 02/03/2015 which is a total of 30 days during which time she used her machine every night with percent used days greater than 4 hours of 97%, indicating excellent  compliance with an average usage of 7 hours and 16 minutes on a pressure of 12 cm with EPR of 2. Residual AHI was acceptable at 3.4 per hour with a low leak at 3.4 L/m for the 95th percentile.  She was diagnosed with obstructive sleep apnea several years ago. Her last sleep study was about 10 years ago. She complains of daytime somnolence and forgetfulness. Unfortunately, I did not have her prior sleep study results available for review. She was given a CPAP machine some 10 years ago and has been using her CPAP. She has been  compliant when she is in bed, but she had more daytime sleepiness. She has been snoring through the mask and she has a FFM, which can leak, per husband. She had recent memory issues and also confusion while driving. She got lost while driving once.  She has a tendency to fall asleep in the den and then goes to her bedroom in the early morning hours sometimes. She may sleep tilt 9 or 10 AM. She does not drink any caffeine. She is a nonsmoker and does not drink any alcohol. She had gained some weight since her last sleep study.  Her Past Medical History Is Significant For: Past Medical History  Diagnosis Date  . Diabetes mellitus   . Hypertension   . Asthma   . Hyperlipidemia   . OSA (obstructive sleep apnea)     on CPAP     Her Past Surgical History Is Significant For: Past Surgical History  Procedure Laterality Date  . Cholecystectomy  1965  . Abdominal hysterectomy  1964    Her Family History Is Significant For: Family History  Problem Relation Age of Onset  . Diabetes Mother   . Hypertension Mother   . Alzheimer's disease Mother   . Stomach cancer Sister     Her Social History Is Significant For: Social History   Social History  . Marital Status: Married    Spouse Name: Jenna Foster  . Number of Children: 3  . Years of Education: 11   Occupational History  . Retired    Social History Main Topics  . Smoking status: Never Smoker   . Smokeless tobacco: Never Used  . Alcohol Use: No  . Drug Use: No  . Sexual Activity: Not Currently   Other Topics Concern  . None   Social History Narrative    Her Allergies Are:  Allergies  Allergen Reactions  . Bactrim Hives  . Sulfur Rash  :   Her Current Medications Are:  Outpatient Encounter Prescriptions as of 08/18/2015  Medication Sig  . abatacept (ORENCIA) 250 MG injection Inject 250 mg into the vein every 7 (seven) weeks.   Marland Kitchen aspirin 81 MG EC tablet Take 81 mg by mouth daily.   . Calcium Carbonate-Vitamin D (CALTRATE  600+D PO) Take 1 tablet by mouth daily.   . Cyanocobalamin (VITAMIN B-12) 1000 MCG/15ML LIQD Take 10 mLs by mouth daily.   Marland Kitchen donepezil (ARICEPT) 5 MG tablet Take 5 mg by mouth every evening.  . gabapentin (NEURONTIN) 300 MG capsule Take 300 mg by mouth at bedtime.  Marland Kitchen levothyroxine (SYNTHROID, LEVOTHROID) 50 MCG tablet TAKE 1 TABLET BY MOUTH DAILY ON AN EMPTY STOMACH  . losartan-hydrochlorothiazide (HYZAAR) 100-12.5 MG per tablet Take 1 tablet by mouth daily.   . metFORMIN (GLUCOPHAGE-XR) 500 MG 24 hr tablet Take 500 mg by mouth 2 (two) times daily.  . metoprolol tartrate (LOPRESSOR) 25 MG tablet Take 25 mg by mouth daily.   Marland Kitchen  metroNIDAZOLE (FLAGYL) 500 MG tablet Take 1 tablet (500 mg total) by mouth 3 (three) times daily.  . Multiple Vitamins-Minerals (CENTRUM SILVER PO) Take 1 tablet by mouth daily.    . pravastatin (PRAVACHOL) 40 MG tablet Take 1 tablet by mouth daily.  . [DISCONTINUED] ciprofloxacin (CIPRO) 500 MG tablet Take 1 tablet (500 mg total) by mouth every 12 (twelve) hours.  . [DISCONTINUED] metFORMIN (GLUCOPHAGE) 500 MG tablet Take 500 mg by mouth 2 (two) times daily with a meal.   . [DISCONTINUED] oxyCODONE-acetaminophen (PERCOCET/ROXICET) 5-325 MG per tablet Take 1-2 tablets by mouth every 4 (four) hours as needed for severe pain.   No facility-administered encounter medications on file as of 08/18/2015.  :  Review of Systems:  Out of a complete 14 point review of systems, all are reviewed and negative with the exception of these symptoms as listed below:   Review of Systems  Neurological:       Patient is requesting new CPAP supplies (mask, etc)     Objective:  Neurologic Exam  Physical Exam Physical Examination:   Filed Vitals:   08/18/15 1408  BP: 130/58  Pulse: 68  Resp: 16   General Examination: The patient is a very pleasant 74 y.o. female in no acute distress. She appears well-developed and well-nourished and well groomed. She is in good spirits  today.  HEENT: Normocephalic, atraumatic, pupils are equal, round and reactive to light and accommodation. She has mild bilateral cataracts, unchanged. Funduscopic exam is normal with sharp disc margins noted. Extraocular tracking is good without limitation to gaze excursion or nystagmus noted. Normal smooth pursuit is noted. Hearing is grossly intact, hearing aids in place b/l. Face is symmetric with normal facial animation and normal facial sensation. Speech is clear with no dysarthria noted. There is no hypophonia. There is no lip, neck/head, jaw or voice tremor. Neck is supple with full range of passive and active motion. There are no carotid bruits on auscultation. Oropharynx exam reveals: mild mouth dryness, adequate dental hygiene and moderate airway crowding, due to redundant soft palate and longer uvula. Mallampati is class II. Tongue protrudes centrally and palate elevates symmetrically. Tonsils are either absent or very small. She has a Mild overbite. Nasal inspection reveals no significant nasal mucosal bogginess or redness and no septal deviation.   Chest: Clear to auscultation without wheezing, rhonchi or crackles noted.  Heart: S1+S2+0, regular and normal without murmurs, rubs or gallops noted.   Abdomen: Soft, non-tender and non-distended with normal bowel sounds appreciated on auscultation.  Extremities: There is 1+ pitting edema in the distal lower extremities bilaterally. She is not wearing compression stockings today. Pedal pulses are intact.  Skin: Warm and dry without trophic changes noted. There are no varicose veins.  Musculoskeletal: exam reveals no obvious joint deformities, tenderness or joint swelling or erythema.   Neurologically:  Mental status: The patient is awake, alert and oriented in all 4 spheres. Her immediate and remote memory, attention, language skills and fund of knowledge are fairly appropriate. There is no evidence of aphasia, agnosia, apraxia or anomia.  Speech is clear with normal prosody and enunciation. Thought process is linear. Mood is normal and affect is normal.   On 02/05/2015: MMSE: 29/30, CDT: 4/4, AFT: 12/min, GDS: 3/15.  On 08/18/2015: MMSE: 29/30, CDT: 4/4, AFT: 13/min.   Cranial nerves II - XII are as described above under HEENT exam. In addition: shoulder shrug is normal with equal shoulder height noted. Motor exam: Normal bulk, strength and tone  is noted. There is no drift, tremor or rebound. Romberg is negative. Reflexes are 1+ in the upper extremities, trace in the knees and absent in the ankles. Fine motor skills and coordination: intact with normal finger taps, normal hand movements, normal rapid alternating patting, normal foot taps and normal foot agility.  Cerebellar testing: No dysmetria or intention tremor on finger to nose testing. Heel to shin is slightly dfficult d/t decrease in ROM. There is no truncal or gait ataxia.  Sensory exam: intact to light touch, pinprick, vibration, temperature sense in the upper and lower extremities, with the exception of mild decrease in vibration sense in the distal lower extremities, unchanged from last time. Gait, station and balance: She stands with difficulty. No veering to one side is noted. No leaning to one side is noted. Posture is age-appropriate and stance is narrow based. Gait shows normal stride length and normal pace. No problems turning are noted. She turns in 3 steps. Tandem walk is difficult for her, unchanged.   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 She presents for follow-up consultation of her obstructive sleep apnea,  Established on treatment  with CPAP at home. She had a split-night sleep study in December of 2015, and we talked about the results last time. Today, we reviewed her most recent compliance data and I pointed out the data points to her. She is congratulated on her excellent compliance. She feels that she has adjusted well to the new machine and treatment. We talked about her memory complaints. She feels stable.  Her memory scores is stable or slightly better. We will continue to monitor this. She had a recent health setback with colitis. She is feeling better however. Thankfully she has been able to tolerate Flagyl and has one day left of antibiotic treatment. She is supposed to make a follow-up appointment with her GI doctor, Dr. Collene Mares and reports that she will do this today. We talked about trying to maintain a healthy lifestyle in general, as well as the importance of weight control. I explained the importance of being compliant with PAP treatment, not only for insurance purposes but primarily to improve Her symptoms, and for the patient's long term health benefit, including to reduce Her cardiovascular risks. I would like to see her back in 6 months, sooner if the need arises. I answered all her questions today and the patient was in agreement.  I spent 20 minutes in total face-to-face time with the patient, more than 50% of which was spent in counseling and coordination of care, reviewing test results, reviewing medication and discussing or reviewing the diagnosis of OSA and memory loss, the prognosis and treatment options.

## 2015-08-21 ENCOUNTER — Telehealth: Payer: Self-pay | Admitting: Neurology

## 2015-08-21 NOTE — Telephone Encounter (Signed)
Christine/[facility] [phone] called to advise, order pended for heated humidifier. Medicare no longer contracted.

## 2015-08-21 NOTE — Telephone Encounter (Signed)
I called patient 2x about switching DME companies since [facility] no longer accepts Medicare. I will try again later.

## 2015-08-26 NOTE — Telephone Encounter (Signed)
I called again a couple more times but phone was busy each time. I will send patient a letter letting her know that we have tried to contact her.

## 2015-09-01 ENCOUNTER — Telehealth: Payer: Self-pay | Admitting: Neurology

## 2015-09-01 NOTE — Telephone Encounter (Signed)
Patient called in response to letter she received about switching from [facility] to [facility] for CPAP supplies. Confirmed phone number with patient [phone]. Patient would like to switch to [facility] of CPAP supplies.

## 2015-09-01 NOTE — Telephone Encounter (Signed)
I sent order to [facility] at [facility] today.

## 2015-09-03 DIAGNOSIS — R413 Other amnesia: Secondary | ICD-10-CM | POA: Diagnosis not present

## 2015-09-03 DIAGNOSIS — Z79899 Other long term (current) drug therapy: Secondary | ICD-10-CM | POA: Diagnosis not present

## 2015-09-03 DIAGNOSIS — M0579 Rheumatoid arthritis with rheumatoid factor of multiple sites without organ or systems involvement: Secondary | ICD-10-CM | POA: Diagnosis not present

## 2015-09-08 ENCOUNTER — Other Ambulatory Visit: Payer: Self-pay | Admitting: Gastroenterology

## 2015-09-08 DIAGNOSIS — Z86010 Personal history of colonic polyps: Secondary | ICD-10-CM | POA: Diagnosis not present

## 2015-09-08 DIAGNOSIS — Z800 Family history of malignant neoplasm of digestive organs: Secondary | ICD-10-CM | POA: Diagnosis not present

## 2015-09-08 DIAGNOSIS — Z1211 Encounter for screening for malignant neoplasm of colon: Secondary | ICD-10-CM | POA: Diagnosis not present

## 2015-09-08 DIAGNOSIS — E669 Obesity, unspecified: Secondary | ICD-10-CM | POA: Diagnosis not present

## 2015-09-08 DIAGNOSIS — R933 Abnormal findings on diagnostic imaging of other parts of digestive tract: Secondary | ICD-10-CM | POA: Diagnosis not present

## 2015-09-15 ENCOUNTER — Encounter (HOSPITAL_COMMUNITY): Payer: Self-pay | Admitting: Emergency Medicine

## 2015-09-15 ENCOUNTER — Emergency Department (INDEPENDENT_AMBULATORY_CARE_PROVIDER_SITE_OTHER)
Admission: EM | Admit: 2015-09-15 | Discharge: 2015-09-15 | Disposition: A | Discharge status: Home / Self Care | Payer: Medicare Other | Source: Home / Self Care | Attending: Family Medicine | Admitting: Family Medicine

## 2015-09-15 DIAGNOSIS — M792 Neuralgia and neuritis, unspecified: Secondary | ICD-10-CM | POA: Diagnosis not present

## 2015-09-15 MED ORDER — LIDOCAINE 5 % EX PTCH: 1.0000 | MEDICATED_PATCH | CUTANEOUS | Status: DC

## 2015-09-15 NOTE — Discharge Instructions (Signed)
Carefully observe for any rash in the next 2-3 days. (Occasionally a patient will develop nerve pain from shingles, but the rash does not appear until the pain has been present for several days.)

## 2015-09-15 NOTE — ED Notes (Signed)
Pt here with c/o right leg pain, more with touch and walking x 2 dys Pt is Diabetic with Hx Neuropathy  No swelling seen

## 2015-09-15 NOTE — ED Provider Notes (Signed)
CSN: [id number]     Arrival date & time 09/15/15  1441 History   First MD Initiated Contact with Patient 09/15/15 1542     No chief complaint on file.  (Consider location/radiation/quality/duration/timing/severity/associated sxs/prior Treatment) The history is provided by the patient.   this is a 74 year old woman with a 15+ year history of diabetes and known pedal diabetic neuropathy who presents with 2 days of tenderness and mild pain in the right lateral knee area just about 1 inch below the fibular head. She can remember no trauma or no increase or change in activity. She has no Pain, shortness of breath, chest pain. She's had no fever or rash.  Even light touch causes pain. She's currently taking gabapentin for her neuropathy.  Blood sugars are running consistently under 150  Past Medical History  Diagnosis Date  . Diabetes mellitus   . Hypertension   . Asthma   . Hyperlipidemia   . OSA (obstructive sleep apnea)     on CPAP    Past Surgical History  Procedure Laterality Date  . Cholecystectomy  1965  . Abdominal hysterectomy  1964   Family History  Problem Relation Age of Onset  . Diabetes Mother   . Hypertension Mother   . Alzheimer's disease Mother   . Stomach cancer Sister    Social History  Substance Use Topics  . Smoking status: Never Smoker   . Smokeless tobacco: Never Used  . Alcohol Use: No   OB History    No data available     Review of Systems  Constitutional: Negative.   HENT: Negative.   Eyes: Negative.   Respiratory: Negative.   Cardiovascular: Negative.   Genitourinary: Negative.     Allergies  Bactrim and Sulfur  Home Medications   Prior to Admission medications   Medication Sig Start Date End Date Taking? Authorizing Provider  abatacept (ORENCIA) 250 MG injection Inject 250 mg into the vein every 7 (seven) weeks.     Historical Provider, MD  aspirin 81 MG EC tablet Take 81 mg by mouth daily.     Historical Provider, MD  Calcium  Carbonate-Vitamin D (CALTRATE 600+D PO) Take 1 tablet by mouth daily.     Historical Provider, MD  Cyanocobalamin (VITAMIN B-12) 1000 MCG/15ML LIQD Take 10 mLs by mouth daily.     Historical Provider, MD  donepezil (ARICEPT) 5 MG tablet Take 5 mg by mouth every evening. 06/03/15   Historical Provider, MD  gabapentin (NEURONTIN) 300 MG capsule Take 300 mg by mouth at bedtime.    Historical Provider, MD  levothyroxine (SYNTHROID, LEVOTHROID) 50 MCG tablet TAKE 1 TABLET BY MOUTH DAILY ON AN EMPTY STOMACH 05/27/13   Historical Provider, MD  losartan-hydrochlorothiazide (HYZAAR) 100-12.5 MG per tablet Take 1 tablet by mouth daily.     Historical Provider, MD  metFORMIN (GLUCOPHAGE-XR) 500 MG 24 hr tablet Take 500 mg by mouth 2 (two) times daily. 07/20/15   Historical Provider, MD  metoprolol tartrate (LOPRESSOR) 25 MG tablet Take 25 mg by mouth daily.     Historical Provider, MD  metroNIDAZOLE (FLAGYL) 500 MG tablet Take 1 tablet (500 mg total) by mouth 3 (three) times daily. 08/12/15   Raeford Razor, MD  Multiple Vitamins-Minerals (CENTRUM SILVER PO) Take 1 tablet by mouth daily.      Historical Provider, MD  pravastatin (PRAVACHOL) 40 MG tablet Take 1 tablet by mouth daily. 03/15/14   Historical Provider, MD   Meds Ordered and Administered this Visit  Medications -  No data to display  BP 138/59 mmHg  Pulse 60  Temp(Src) 98.6 F (37 C) (Oral)  Resp 16  SpO2 98% No data found.   Physical Exam  Constitutional: She is oriented to person, place, and time. She appears well-developed and well-nourished.  HENT:  Head: Normocephalic and atraumatic.  Right Ear: External ear normal.  Mouth/Throat: Oropharynx is clear and moist.  Eyes: Conjunctivae and EOM are normal. Pupils are equal, round, and reactive to light.  Neck: Normal range of motion. Neck supple. No thyromegaly present.  Cardiovascular: Normal rate.   Pulmonary/Chest: Effort normal.  Abdominal: Soft.  Musculoskeletal: Normal range of  motion.  Neurological: She is alert and oriented to person, place, and time. She displays normal reflexes. No cranial nerve deficit. Coordination normal.  Exquisitely tender skin over right fibular head  Nursing note and vitals reviewed.   ED Course  Procedures (including critical care time)    MDM      ICD-9-CM ICD-10-CM   1. Neuropathic pain 729.2 M79.2 lidocaine (LIDODERM) 5 %   patient told to watch the leg for rash since shingles may develop a rash after the pain begins.   Signed, Elvina Sidle, MD   Elvina Sidle, MD 09/15/15 1600

## 2015-10-01 DIAGNOSIS — M0579 Rheumatoid arthritis with rheumatoid factor of multiple sites without organ or systems involvement: Secondary | ICD-10-CM | POA: Diagnosis not present

## 2015-10-14 ENCOUNTER — Encounter (HOSPITAL_COMMUNITY): Payer: Self-pay | Admitting: *Deleted

## 2015-10-21 ENCOUNTER — Encounter (HOSPITAL_COMMUNITY): Payer: Self-pay

## 2015-10-21 ENCOUNTER — Encounter (HOSPITAL_COMMUNITY)
Admission: RE | Disposition: A | Discharge status: Home / Self Care | Payer: Self-pay | Source: Ambulatory Visit | Attending: Gastroenterology

## 2015-10-21 ENCOUNTER — Ambulatory Visit (HOSPITAL_COMMUNITY)
Admission: RE | Admit: 2015-10-21 | Discharge: 2015-10-21 | Disposition: A | Discharge status: Home / Self Care | Payer: Medicare Other | Source: Ambulatory Visit | Attending: Gastroenterology | Admitting: Gastroenterology

## 2015-10-21 ENCOUNTER — Ambulatory Visit (HOSPITAL_COMMUNITY): Payer: Medicare Other | Admitting: Certified Registered Nurse Anesthetist

## 2015-10-21 DIAGNOSIS — Z79899 Other long term (current) drug therapy: Secondary | ICD-10-CM | POA: Insufficient documentation

## 2015-10-21 DIAGNOSIS — M069 Rheumatoid arthritis, unspecified: Secondary | ICD-10-CM | POA: Diagnosis not present

## 2015-10-21 DIAGNOSIS — E785 Hyperlipidemia, unspecified: Secondary | ICD-10-CM | POA: Insufficient documentation

## 2015-10-21 DIAGNOSIS — E119 Type 2 diabetes mellitus without complications: Secondary | ICD-10-CM | POA: Insufficient documentation

## 2015-10-21 DIAGNOSIS — G629 Polyneuropathy, unspecified: Secondary | ICD-10-CM | POA: Insufficient documentation

## 2015-10-21 DIAGNOSIS — I10 Essential (primary) hypertension: Secondary | ICD-10-CM | POA: Insufficient documentation

## 2015-10-21 DIAGNOSIS — G4733 Obstructive sleep apnea (adult) (pediatric): Secondary | ICD-10-CM | POA: Insufficient documentation

## 2015-10-21 DIAGNOSIS — Z7982 Long term (current) use of aspirin: Secondary | ICD-10-CM | POA: Diagnosis not present

## 2015-10-21 DIAGNOSIS — Z86010 Personal history of colonic polyps: Secondary | ICD-10-CM | POA: Insufficient documentation

## 2015-10-21 DIAGNOSIS — Z9049 Acquired absence of other specified parts of digestive tract: Secondary | ICD-10-CM | POA: Insufficient documentation

## 2015-10-21 DIAGNOSIS — J45909 Unspecified asthma, uncomplicated: Secondary | ICD-10-CM | POA: Diagnosis not present

## 2015-10-21 DIAGNOSIS — Z7984 Long term (current) use of oral hypoglycemic drugs: Secondary | ICD-10-CM | POA: Insufficient documentation

## 2015-10-21 DIAGNOSIS — Z86718 Personal history of other venous thrombosis and embolism: Secondary | ICD-10-CM | POA: Diagnosis not present

## 2015-10-21 DIAGNOSIS — K635 Polyp of colon: Secondary | ICD-10-CM | POA: Diagnosis not present

## 2015-10-21 DIAGNOSIS — K621 Rectal polyp: Secondary | ICD-10-CM | POA: Diagnosis not present

## 2015-10-21 DIAGNOSIS — Z1211 Encounter for screening for malignant neoplasm of colon: Secondary | ICD-10-CM | POA: Diagnosis not present

## 2015-10-21 HISTORY — DX: Unspecified osteoarthritis, unspecified site: M19.90

## 2015-10-21 HISTORY — DX: Deep phlebothrombosis in pregnancy, unspecified trimester: O22.30

## 2015-10-21 HISTORY — PX: COLONOSCOPY WITH PROPOFOL: SHX5780

## 2015-10-21 HISTORY — DX: Myoneural disorder, unspecified: G70.9

## 2015-10-21 LAB — GLUCOSE, CAPILLARY: GLUCOSE-CAPILLARY: 115 mg/dL — AB (ref 65–99)

## 2015-10-21 SURGERY — COLONOSCOPY WITH PROPOFOL
Anesthesia: Monitor Anesthesia Care

## 2015-10-21 MED ORDER — LACTATED RINGERS IV SOLN
INTRAVENOUS | Status: DC
  Administered 2015-10-21: 1000 mL via INTRAVENOUS

## 2015-10-21 MED ORDER — GLYCOPYRROLATE 0.2 MG/ML IJ SOLN
INTRAMUSCULAR | Status: AC
  Filled 2015-10-21: qty 1

## 2015-10-21 MED ORDER — ONDANSETRON HCL 4 MG/2ML IJ SOLN
INTRAMUSCULAR | Status: AC
  Filled 2015-10-21: qty 2

## 2015-10-21 MED ORDER — PROPOFOL 500 MG/50ML IV EMUL
INTRAVENOUS | Status: DC | PRN
  Administered 2015-10-21: 300 ug/kg/min via INTRAVENOUS

## 2015-10-21 MED ORDER — ONDANSETRON HCL 4 MG/2ML IJ SOLN
INTRAMUSCULAR | Status: DC | PRN
  Administered 2015-10-21: 4 mg via INTRAVENOUS

## 2015-10-21 MED ORDER — PROPOFOL 10 MG/ML IV BOLUS
INTRAVENOUS | Status: AC
  Filled 2015-10-21: qty 20

## 2015-10-21 MED ORDER — PROPOFOL 10 MG/ML IV BOLUS
INTRAVENOUS | Status: AC
  Filled 2015-10-21: qty 40

## 2015-10-21 MED ORDER — SODIUM CHLORIDE 0.9 % IV SOLN: INTRAVENOUS | Status: DC

## 2015-10-21 SURGICAL SUPPLY — 22 items

## 2015-10-21 NOTE — Anesthesia Postprocedure Evaluation (Signed)
Anesthesia Post Note  Patient: Jenna Foster  Procedure(s) Performed: Procedure(s) (LRB): COLONOSCOPY WITH PROPOFOL (N/A)  Patient location during evaluation: PACU Anesthesia Type: MAC Level of consciousness: awake, awake and alert and oriented Pain management: pain level controlled Vital Signs Assessment: post-procedure vital signs reviewed and stable Respiratory status: spontaneous breathing, nonlabored ventilation and respiratory function stable Cardiovascular status: blood pressure returned to baseline and stable Postop Assessment: No signs of nausea or vomiting Anesthetic complications: no    Last Vitals:  Filed Vitals:   10/21/15 0810 10/21/15 0820  BP: 111/53 129/43  Pulse: 81 73  Temp:    Resp: 20 12    Last Pain: There were no vitals filed for this visit.               Cecile Hearing

## 2015-10-21 NOTE — Transfer of Care (Signed)
Immediate Anesthesia Transfer of Care Note  Patient: Jenna Foster  Procedure(s) Performed: Procedure(s): COLONOSCOPY WITH PROPOFOL (N/A)  Patient Location: PACU  Anesthesia Type:MAC  Level of Consciousness:  sedated, patient cooperative and responds to stimulation  Airway & Oxygen Therapy:Patient Spontanous Breathing and Patient connected to face mask oxgen  Post-op Assessment:  Report given to PACU RN and Post -op Vital signs reviewed and stable  Post vital signs:  Reviewed and stable  Last Vitals:  Filed Vitals:   10/21/15 0710  BP: 176/63  Pulse: 98  Temp: 36.8 C  Resp: 16    Complications: No apparent anesthesia complications

## 2015-10-21 NOTE — Anesthesia Preprocedure Evaluation (Addendum)
Anesthesia Evaluation  Patient identified by MRN, date of birth, ID band Patient awake    Reviewed: Allergy & Precautions, NPO status , Patient's Chart, lab work & pertinent test results, reviewed documented beta blocker date and time   History of Anesthesia Complications Negative for: history of anesthetic complications  Airway Mallampati: I  TM Distance: >3 FB Neck ROM: Full    Dental  (+) Dental Advisory Given, Upper Dentures, Partial Lower   Pulmonary asthma , sleep apnea and Continuous Positive Airway Pressure Ventilation ,    Pulmonary exam normal breath sounds clear to auscultation       Cardiovascular hypertension, Pt. on medications and Pt. on home beta blockers (-) angina(-) CAD and (-) Past MI Normal cardiovascular exam+ dysrhythmias  Rhythm:Regular Rate:Normal     Neuro/Psych  Neuromuscular disease negative psych ROS   GI/Hepatic negative GI ROS, Neg liver ROS,   Endo/Other  diabetes, Type 2, Oral Hypoglycemic AgentsObesity   Renal/GU negative Renal ROS     Musculoskeletal  (+) Arthritis , Rheumatoid disorders,    Abdominal   Peds  Hematology negative hematology ROS (+)   Anesthesia Other Findings Day of surgery medications reviewed with the patient.  Reproductive/Obstetrics                            Anesthesia Physical Anesthesia Plan  ASA: III  Anesthesia Plan: MAC   Post-op Pain Management:    Induction: Intravenous  Airway Management Planned: Simple Face Mask  Additional Equipment:   Intra-op Plan:   Post-operative Plan:   Informed Consent: I have reviewed the patients History and Physical, chart, labs and discussed the procedure including the risks, benefits and alternatives for the proposed anesthesia with the patient or authorized representative who has indicated his/her understanding and acceptance.   Dental advisory given  Plan Discussed with: CRNA and  Anesthesiologist  Anesthesia Plan Comments: (Discussed risks/benefits/alternatives to MAC sedation including need for ventilatory support, hypotension, need for conversion to general anesthesia.  All patient questions answered.  Patient wished to proceed.)        Anesthesia Quick Evaluation

## 2015-10-21 NOTE — Op Note (Signed)
[facility] [address] [place], [address]   OPERATIVE PROCEDURE REPORT  PATIENT: Jenna, Foster  MR#: [id number] BIRTHDATE: 1941-03-27 GENDER: female ENDOSCOPIST: Lorenza Burton, MD ASSISTANT:   Beryle Beams, technician & Priscella Killian Ress, RN. PROCEDURE DATE: 11/02/2015 PRE-PROCEDURE PREPARATION: Patient fasted for 4 hours prior to procedure. The patient was prepped with a gallon of Golytely the night prior to the procedure. PRE-PROCEDURE PHYSICAL: Patient has stable vital signs.  Neck is supple.  There is no JVD, thyromegaly or LAD.  Chest clear to auscultation.  S1 and S2 regular.  Abdomen soft, non-distended, non-tender with NABS. PROCEDURE:     Colonoscopy with cold biopsy x 1. ASA CLASS:     Class III INDICATIONS:     1.  Personal history of colonic polyps 2. Colorectal cancer screening. MEDICATIONS:     Monitored anesthesia care.  DESCRIPTION OF PROCEDURE: After the risks, benefits, and alternatives of the procedure were thoroughly explained [including a 10% missed rate of cancer and polyps], informed consent was obtained. Digital rectal exam was performed. The Pentax adult Colonscope [id number]  was introduced through the anus  and advanced to the terminal ileum which was intubated for a short distance , limited by No adverse events experienced.  The quality of the prep was good. Multiple washes were done. Small lesions could be missed. The instrument was then slowly withdrawn as the colon was fully examined. Estimated blood loss is zero unless otherwise noted in this procedure report.     COLON FINDINGS: A single dimunitive polyp was found in the rectum-this was removed by cold biopsy x 1. The rest of the entire colonic mucosa appeared healthy with a normal vascular pattern. No masses, diverticula or AVMs were noted. The appendiceal orifice and the ICV were identified and photographed. The terminal ileum appeared normal.  Retroflexed views  revealed no abnormalities.  The patient tolerated the procedure without immediate complications. The scope was then withdrawn from the patient and the procedure terminated.  TIME TO CECUM:   7 minutes 00 seconds WITHDRAW TIME:  6 minutes 00 seconds  IMPRESSION:     One single dimunitive polyp was found in the rectum-removed by cold biopsy x 1; otherwise normal colonoscopy upto the terminal ileum.  RECOMMENDATIONS:     1.  Continue current medications. 2.  Continue surveillance. 3.  Await pathology results. 4.  High fiber diet with liberal fluid intake. 5.  OP follow-up is advised on a PRN basis.  REPEAT EXAM:      In 7 years  for a Cologaurd stool test.  If the patient has any abnormal GI symptoms in the interim, she have been advised to contact the office as soon as possible for further recommendations.   REFERRED YI:AXKPVV Jarold Motto, M.D. eSigned:  Lorenza Burton, MD 11/02/15 7:56 AM  CPT CODES:     [phone] Colonoscopy, flexible, proximal to splenic flexure; with biopsy, single or multiple ICD CODES:     K63.5 Colonic polyp Z86.010 Personal history of colonic polyps Z12/11 CRC screening  The ICD and CPT codes recommended by this software are interpretations from the data that the clinical staff has captured with the software.  The verification of the translation of this report to the ICD and CPT codes and modifiers is the sole responsibility of the health care institution and practicing physician where this report was generated.  [employer] will not be held responsible for the validity of the ICD and CPT codes included on this  report.  AMA assumes no liability for data contained or not contained herein. CPT is a Publishing rights manager of the [employer].  PATIENT NAME:  Jenna, Foster MR#: [id number]

## 2015-10-21 NOTE — H&P (Signed)
Jenna Foster is an 74 y.o. female.   Chief Complaint: Colorectal cancer screening. HPI: 75 year old black female here for colorectal cancer screening. See office notes for details.   Past Medical History  Diagnosis Date  . Diabetes mellitus   . Hypertension   . Asthma   . Hyperlipidemia   . OSA (obstructive sleep apnea)     on CPAP   . DVT (deep vein thrombosis) in pregnancy     leg- '63-left foot tends to swell.  . Neuromuscular disorder (HCC)     neuropapthy-bilateral  . Arthritis     Rheumatoid Arthritis-  infusion tx. every 4 weeks,next 10-29-15 .   Past Surgical History  Procedure Laterality Date  . Cholecystectomy  1965  . Abdominal hysterectomy  1964  . Breast surgery Left      left breast cystectomy   Family History  Problem Relation Age of Onset  . Diabetes Mother   . Hypertension Mother   . Alzheimer's disease Mother   . Stomach cancer Sister    Social History:  reports that she has never smoked. She has never used smokeless tobacco. She reports that she does not drink alcohol or use illicit drugs.  Allergies:  Allergies  Allergen Reactions  . Bactrim Hives   Medications Prior to Admission  Medication Sig Dispense Refill  . abatacept (ORENCIA) 250 MG injection Inject 250 mg into the vein every 28 (twenty-eight) days.     Marland Kitchen aspirin 81 MG EC tablet Take 81 mg by mouth daily.     . Cyanocobalamin (VITAMIN B-12) 1000 MCG/15ML LIQD Take 10 mLs by mouth daily.     Marland Kitchen donepezil (ARICEPT) 5 MG tablet Take 5 mg by mouth daily with supper.   11  . gabapentin (NEURONTIN) 300 MG capsule Take 600 mg by mouth 2 (two) times daily.     Marland Kitchen GAVILYTE-G 236 G solution Take 4,000 mLs by mouth as directed.  0  . losartan-hydrochlorothiazide (HYZAAR) 100-12.5 MG per tablet Take 1 tablet by mouth daily.     . metFORMIN (GLUCOPHAGE-XR) 500 MG 24 hr tablet Take 500 mg by mouth 2 (two) times daily.  6  . metoprolol (LOPRESSOR) 50 MG tablet Take 50 mg by mouth 2 (two) times daily.     . Multiple Vitamins-Minerals (CENTRUM SILVER PO) Take 1 tablet by mouth daily.      Marland Kitchen OVER THE COUNTER MEDICATION Take 1 tablet by mouth daily. Nature Made Bone Support with Vit. D3 Gummy    . OVER THE COUNTER MEDICATION Take 2 tablets by mouth daily. Nature Made Hair Skin and Nails Gummy    . pravastatin (PRAVACHOL) 40 MG tablet Take 40 mg by mouth daily.      Review of Systems  Constitutional: Negative.   HENT: Negative.   Eyes: Negative.   Respiratory: Negative.   Gastrointestinal: Negative.   Genitourinary: Negative.   Musculoskeletal: Positive for joint pain.  Skin: Negative.   Neurological: Negative.   Endo/Heme/Allergies: Negative.   Psychiatric/Behavioral: Negative.    There were no vitals taken for this visit. Physical Exam  Constitutional: She is oriented to person, place, and time. She appears well-developed and well-nourished.  HENT:  Head: Normocephalic and atraumatic.  Eyes: EOM are normal.  Neck: Neck supple.  Cardiovascular: Normal rate and regular rhythm.   Respiratory: Effort normal and breath sounds normal.  GI: Soft. Bowel sounds are normal. She exhibits no distension and no mass. There is no tenderness. There is no rebound and no  guarding.  Neurological: She is alert and oriented to person, place, and time.  Skin: Skin is warm and dry.  Psychiatric: She has a normal mood and affect. Her behavior is normal. Judgment and thought content normal.    Assessment/Plan Colorectal cancer screening/personal history of adenomatous polyps: proceed with a colonoscopy at this time.  Shaylin Blatt 10/21/2015, 7:08 AM

## 2015-10-21 NOTE — Discharge Instructions (Signed)

## 2015-10-22 ENCOUNTER — Encounter (HOSPITAL_COMMUNITY): Payer: Self-pay | Admitting: Gastroenterology

## 2015-10-29 DIAGNOSIS — M0579 Rheumatoid arthritis with rheumatoid factor of multiple sites without organ or systems involvement: Secondary | ICD-10-CM | POA: Diagnosis not present

## 2015-11-26 ENCOUNTER — Telehealth: Payer: Self-pay | Admitting: Neurology

## 2015-11-26 NOTE — Telephone Encounter (Signed)
Pt called and needs to know who she gets her cpap supplies from. Pt called [facility] but they do not have her in their system. Please call and advise [phone]

## 2015-11-26 NOTE — Telephone Encounter (Signed)
Robin: Would you please find out the details for her?

## 2015-11-27 NOTE — Telephone Encounter (Signed)
I sent a request to out new contact at [facility]) to have her check on this. Order was sent in 09/01/15.

## 2015-12-02 ENCOUNTER — Other Ambulatory Visit: Payer: Self-pay

## 2015-12-02 DIAGNOSIS — G4733 Obstructive sleep apnea (adult) (pediatric): Secondary | ICD-10-CM

## 2015-12-02 DIAGNOSIS — Z9989 Dependence on other enabling machines and devices: Principal | ICD-10-CM

## 2015-12-03 ENCOUNTER — Ambulatory Visit
Admission: RE | Admit: 2015-12-03 | Discharge: 2015-12-03 | Disposition: A | Discharge status: Home / Self Care | Payer: Medicare Other | Source: Ambulatory Visit | Attending: Infectious Disease | Admitting: Infectious Disease

## 2015-12-03 ENCOUNTER — Encounter: Payer: Self-pay | Admitting: Infectious Disease

## 2015-12-03 ENCOUNTER — Ambulatory Visit (INDEPENDENT_AMBULATORY_CARE_PROVIDER_SITE_OTHER): Payer: Medicare Other | Admitting: Infectious Disease

## 2015-12-03 VITALS — BP 126/71 | HR 61 | Temp 97.7°F | Ht 65.0 in | Wt 212.0 lb

## 2015-12-03 DIAGNOSIS — R7611 Nonspecific reaction to tuberculin skin test without active tuberculosis: Secondary | ICD-10-CM

## 2015-12-03 DIAGNOSIS — M05711 Rheumatoid arthritis with rheumatoid factor of right shoulder without organ or systems involvement: Secondary | ICD-10-CM | POA: Diagnosis not present

## 2015-12-03 DIAGNOSIS — R509 Fever, unspecified: Secondary | ICD-10-CM

## 2015-12-03 DIAGNOSIS — Z0000 Encounter for general adult medical examination without abnormal findings: Secondary | ICD-10-CM

## 2015-12-03 DIAGNOSIS — Z23 Encounter for immunization: Secondary | ICD-10-CM

## 2015-12-03 DIAGNOSIS — A150 Tuberculosis of lung: Secondary | ICD-10-CM | POA: Diagnosis not present

## 2015-12-03 HISTORY — DX: Nonspecific reaction to tuberculin skin test without active tuberculosis: R76.11

## 2015-12-03 LAB — COMPLETE METABOLIC PANEL WITH GFR
ALBUMIN: 3.4 g/dL — AB (ref 3.6–5.1)
ALK PHOS: 70 U/L (ref 33–130)
ALT: 45 U/L — AB (ref 6–29)
AST: 68 U/L — AB (ref 10–35)
BUN: 13 mg/dL (ref 7–25)
CALCIUM: 9.5 mg/dL (ref 8.6–10.4)
CO2: 27 mmol/L (ref 20–31)
CREATININE: 0.79 mg/dL (ref 0.60–0.93)
Chloride: 101 mmol/L (ref 98–110)
GFR, Est African American: 86 mL/min (ref >=60)
GFR, Est Non African American: 74 mL/min (ref >=60)
GLUCOSE: 128 mg/dL — AB (ref 65–99)
POTASSIUM: 4.2 mmol/L (ref 3.5–5.3)
SODIUM: 139 mmol/L (ref 135–146)
Total Bilirubin: 0.4 mg/dL (ref 0.2–1.2)
Total Protein: 7 g/dL (ref 6.1–8.1)

## 2015-12-03 NOTE — Progress Notes (Signed)
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 Kathi Ludwig   Subjective:    Patient ID: Jenna Foster, female    DOB: 08-01-1941, 75 y.o.   MRN: [id number]  HPI  75 year old African American lady with RA followed by Dr. Kathi Ludwig with [facility]. Apparently she had trouble tolerating MTX with elevated LFTs and remicaid--"did not work."  She had started on another monoclonal antibodies  Orencia in January of 2016 with nice improvement in symptoms. She had a QF gold that was + in November. From talking to the patient it sounds as if the patient had a questionable + PPD in past 1-2 years as she says she was sent to the [facility] and told that her test was negative.   She has not had CXR until this visit. She does endorse chronic low grade nonproductive cough. She has had no fevers, chills, malaise or weight loss. On the contrary she has had weight gain. She was born in [place] and lived there for most of her life. She has no known TB exposures. She has never been homeless. Her only travel abroad has been to [place] to a resort many years ago.  Past Medical History  Diagnosis Date  . Diabetes mellitus   . Hypertension   . Asthma   . Hyperlipidemia   . OSA (obstructive sleep apnea)     on CPAP   . DVT (deep vein thrombosis) in pregnancy     leg- '63-left foot tends to swell.  . Neuromuscular disorder (HCC)     neuropapthy-bilateral  . Arthritis     Rheumatoid Arthritis-  infusion tx. every 4 weeks,next 10-29-15 .  Marland Kitchen Positive TB test 12/03/2015    Past Surgical History  Procedure Laterality Date  . Cholecystectomy  1965  . Abdominal hysterectomy  1964  . Breast surgery Left      left breast cystectomy  . Colonoscopy with propofol N/A 10/21/2015    Procedure: COLONOSCOPY WITH PROPOFOL;  Surgeon: Charna Elizabeth, MD;  Location: [facility];  Service: Endoscopy;  Laterality: N/A;    Family History  Problem Relation Age of Onset  . Diabetes Mother   . Hypertension Mother   .  Alzheimer's disease Mother   . Stomach cancer Sister       Social History   Social History  . Marital Status: Married    Spouse Name: Dennard Nip  . Number of Children: 3  . Years of Education: 11   Occupational History  . Retired    Social History Main Topics  . Smoking status: Never Smoker   . Smokeless tobacco: Never Used  . Alcohol Use: No  . Drug Use: No  . Sexual Activity: Not Currently   Other Topics Concern  . None   Social History Narrative    Allergies  Allergen Reactions  . Bactrim Hives     Current outpatient prescriptions:  .  aspirin 81 MG EC tablet, Take 81 mg by mouth daily. , Disp: , Rfl:  .  Cyanocobalamin (VITAMIN B-12) 1000 MCG/15ML LIQD, Take 10 mLs by mouth daily. , Disp: , Rfl:  .  donepezil (ARICEPT) 5 MG tablet, Take 5 mg by mouth daily with supper. , Disp: , Rfl: 11 .  gabapentin (NEURONTIN) 300 MG capsule, Take 600 mg by mouth 2 (two) times daily. , Disp: , Rfl:  .  levothyroxine (SYNTHROID, LEVOTHROID) 50 MCG tablet, TK 1 T PO QD, Disp: , Rfl: 2 .  losartan-hydrochlorothiazide (HYZAAR) 100-12.5 MG  per tablet, Take 1 tablet by mouth daily. , Disp: , Rfl:  .  metFORMIN (GLUCOPHAGE-XR) 500 MG 24 hr tablet, Take 500 mg by mouth 2 (two) times daily., Disp: , Rfl: 6 .  metoprolol (LOPRESSOR) 50 MG tablet, Take 50 mg by mouth 2 (two) times daily., Disp: , Rfl:  .  Multiple Vitamins-Minerals (CENTRUM SILVER PO), Take 1 tablet by mouth daily.  , Disp: , Rfl:  .  OVER THE COUNTER MEDICATION, Take 1 tablet by mouth daily. Nature Made Bone Support with Vit. D3 Gummy, Disp: , Rfl:  .  OVER THE COUNTER MEDICATION, Take 2 tablets by mouth daily. Nature Made Hair Skin and Nails Gummy, Disp: , Rfl:  .  pravastatin (PRAVACHOL) 40 MG tablet, Take 40 mg by mouth daily. , Disp: , Rfl:  .  abatacept (ORENCIA) 250 MG injection, Inject 250 mg into the vein every 28 (twenty-eight) days. Reported on 12/03/2015, Disp: , Rfl:  .  amitriptyline (ELAVIL) 25 MG tablet, TK 1 TO  2 TS PO QHS, Disp: , Rfl: 6   Review of Systems  Constitutional: Negative for fever, chills, diaphoresis, activity change, appetite change, fatigue and unexpected weight change.  HENT: Negative for congestion, rhinorrhea, sinus pressure, sneezing, sore throat and trouble swallowing.   Eyes: Negative for photophobia and visual disturbance.  Respiratory: Positive for cough. Negative for chest tightness, shortness of breath, wheezing and stridor.   Cardiovascular: Negative for chest pain, palpitations and leg swelling.  Gastrointestinal: Negative for nausea, vomiting, abdominal pain, diarrhea, constipation, blood in stool, abdominal distention and anal bleeding.  Genitourinary: Negative for dysuria, hematuria, flank pain and difficulty urinating.  Musculoskeletal: Positive for joint swelling and arthralgias. Negative for myalgias, back pain and gait problem.  Skin: Negative for color change, pallor, rash and wound.  Neurological: Negative for dizziness, tremors, weakness and light-headedness.  Hematological: Negative for adenopathy. Does not bruise/bleed easily.  Psychiatric/Behavioral: Negative for behavioral problems, confusion, sleep disturbance, dysphoric mood, decreased concentration and agitation.      Objective:   Physical Exam  Constitutional: She is oriented to person, place, and time. She appears well-developed and well-nourished. No distress.  HENT:  Head: Normocephalic and atraumatic.  Mouth/Throat: No oropharyngeal exudate.  Eyes: Conjunctivae and EOM are normal. No scleral icterus.  Neck: Normal range of motion. Neck supple.  Cardiovascular: Normal rate, regular rhythm and normal heart sounds.  Exam reveals no gallop and no friction rub.   No murmur heard. Pulmonary/Chest: Effort normal and breath sounds normal. No respiratory distress. She has no wheezes.  Abdominal: Soft. Bowel sounds are normal. She exhibits no distension. There is no tenderness.  Musculoskeletal: She  exhibits no edema or tenderness.  Neurological: She is alert and oriented to person, place, and time. She exhibits normal muscle tone. Coordination normal.  Skin: Skin is warm and dry. No rash noted. She is not diaphoretic. No erythema. No pallor.  Psychiatric: She has a normal mood and affect. Her behavior is normal. Judgment and thought content normal.          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 She has zero evidence of systemic disease to suggest extrapulmonary TB.   I am referring her to [facility] for rx with [facility]H  I have sent test for HIV which was negative  Screening: I also sent HCV antibody  RA: would defer rx until she has completed her rx for LTB

## 2015-12-03 NOTE — Patient Instructions (Signed)
After we have blood work today and Chest Xray we will [facility]

## 2015-12-04 LAB — HIV ANTIBODY (ROUTINE TESTING W REFLEX): HIV 1&2 Ab, 4th Generation: NONREACTIVE

## 2015-12-10 DIAGNOSIS — Z79899 Other long term (current) drug therapy: Secondary | ICD-10-CM | POA: Diagnosis not present

## 2015-12-10 DIAGNOSIS — M47816 Spondylosis without myelopathy or radiculopathy, lumbar region: Secondary | ICD-10-CM | POA: Diagnosis not present

## 2015-12-10 DIAGNOSIS — M0579 Rheumatoid arthritis with rheumatoid factor of multiple sites without organ or systems involvement: Secondary | ICD-10-CM | POA: Diagnosis not present

## 2015-12-10 DIAGNOSIS — M549 Dorsalgia, unspecified: Secondary | ICD-10-CM | POA: Diagnosis not present

## 2015-12-10 DIAGNOSIS — R413 Other amnesia: Secondary | ICD-10-CM | POA: Diagnosis not present

## 2016-01-14 DIAGNOSIS — M0589 Other rheumatoid arthritis with rheumatoid factor of multiple sites: Secondary | ICD-10-CM | POA: Diagnosis not present

## 2016-01-14 DIAGNOSIS — M0579 Rheumatoid arthritis with rheumatoid factor of multiple sites without organ or systems involvement: Secondary | ICD-10-CM | POA: Diagnosis not present

## 2016-01-27 DIAGNOSIS — Z6834 Body mass index (BMI) 34.0-34.9, adult: Secondary | ICD-10-CM | POA: Diagnosis not present

## 2016-01-27 DIAGNOSIS — M19011 Primary osteoarthritis, right shoulder: Secondary | ICD-10-CM | POA: Diagnosis not present

## 2016-01-27 DIAGNOSIS — M7541 Impingement syndrome of right shoulder: Secondary | ICD-10-CM | POA: Diagnosis not present

## 2016-02-04 DIAGNOSIS — M25511 Pain in right shoulder: Secondary | ICD-10-CM | POA: Diagnosis not present

## 2016-02-11 DIAGNOSIS — M0579 Rheumatoid arthritis with rheumatoid factor of multiple sites without organ or systems involvement: Secondary | ICD-10-CM | POA: Diagnosis not present

## 2016-02-16 ENCOUNTER — Telehealth: Payer: Self-pay

## 2016-02-16 ENCOUNTER — Ambulatory Visit: Payer: Medicare Other | Admitting: Neurology

## 2016-02-16 NOTE — Telephone Encounter (Signed)
Patient did not show to appt today  

## 2016-02-18 ENCOUNTER — Encounter: Payer: Self-pay | Admitting: Neurology

## 2016-02-26 DIAGNOSIS — E1149 Type 2 diabetes mellitus with other diabetic neurological complication: Secondary | ICD-10-CM | POA: Diagnosis not present

## 2016-02-26 DIAGNOSIS — I10 Essential (primary) hypertension: Secondary | ICD-10-CM | POA: Diagnosis not present

## 2016-02-26 DIAGNOSIS — E038 Other specified hypothyroidism: Secondary | ICD-10-CM | POA: Diagnosis not present

## 2016-02-26 DIAGNOSIS — N390 Urinary tract infection, site not specified: Secondary | ICD-10-CM | POA: Diagnosis not present

## 2016-02-26 DIAGNOSIS — E782 Mixed hyperlipidemia: Secondary | ICD-10-CM | POA: Diagnosis not present

## 2016-02-26 DIAGNOSIS — M109 Gout, unspecified: Secondary | ICD-10-CM | POA: Diagnosis not present

## 2016-02-26 DIAGNOSIS — R8299 Other abnormal findings in urine: Secondary | ICD-10-CM | POA: Diagnosis not present

## 2016-03-10 DIAGNOSIS — M0579 Rheumatoid arthritis with rheumatoid factor of multiple sites without organ or systems involvement: Secondary | ICD-10-CM | POA: Diagnosis not present

## 2016-03-10 DIAGNOSIS — Z23 Encounter for immunization: Secondary | ICD-10-CM | POA: Diagnosis not present

## 2016-03-23 ENCOUNTER — Encounter: Payer: Self-pay | Admitting: *Deleted

## 2016-03-30 DIAGNOSIS — Z1212 Encounter for screening for malignant neoplasm of rectum: Secondary | ICD-10-CM | POA: Diagnosis not present

## 2016-04-07 ENCOUNTER — Other Ambulatory Visit: Payer: Self-pay

## 2016-04-07 DIAGNOSIS — M0579 Rheumatoid arthritis with rheumatoid factor of multiple sites without organ or systems involvement: Secondary | ICD-10-CM | POA: Diagnosis not present

## 2016-04-07 DIAGNOSIS — Z1231 Encounter for screening mammogram for malignant neoplasm of breast: Secondary | ICD-10-CM

## 2016-04-27 ENCOUNTER — Ambulatory Visit (HOSPITAL_COMMUNITY)
Admission: EM | Admit: 2016-04-27 | Discharge: 2016-04-27 | Disposition: A | Discharge status: Home / Self Care | Payer: Medicare Other | Attending: Family Medicine | Admitting: Family Medicine

## 2016-04-27 ENCOUNTER — Encounter (HOSPITAL_COMMUNITY): Payer: Self-pay | Admitting: Emergency Medicine

## 2016-04-27 DIAGNOSIS — M940 Chondrocostal junction syndrome [Tietze]: Secondary | ICD-10-CM | POA: Diagnosis not present

## 2016-04-27 NOTE — ED Provider Notes (Signed)
CSN: [id number]     Arrival date & time 04/27/16  1803 History   First MD Initiated Contact with Patient 04/27/16 1945     Chief Complaint  Patient presents with  . Chest Pain   (Consider location/radiation/quality/duration/timing/severity/associated sxs/prior Treatment) HPI History obtained from patient:  Pt presents with the cc of:  Right chest wall pain Duration of symptoms: 2 days Treatment prior to arrival: no treatment Context: onset of pain while watching TV, no known injury Other symptoms include: none Pain score: 3 FAMILY HISTORY: DM-mother    Past Medical History  Diagnosis Date  . Diabetes mellitus   . Hypertension   . Asthma   . Hyperlipidemia   . OSA (obstructive sleep apnea)     on CPAP   . DVT (deep vein thrombosis) in pregnancy     leg- '63-left foot tends to swell.  . Neuromuscular disorder (HCC)     neuropapthy-bilateral  . Arthritis     Rheumatoid Arthritis-  infusion tx. every 4 weeks,next 10-29-15 .  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 Cholecystectomy  1965  . Abdominal hysterectomy  1964  . Breast surgery Left      left breast cystectomy  . Colonoscopy with propofol N/A 10/21/2015    Procedure: COLONOSCOPY WITH PROPOFOL;  Surgeon: Charna Elizabeth, MD;  Location: [facility];  Service: Endoscopy;  Laterality: N/A;   Family History  Problem Relation Age of Onset  . Diabetes Mother   . Hypertension Mother   . Alzheimer's disease Mother   . Stomach cancer Sister    Social History  Substance Use Topics  . Smoking status: Never Smoker   . Smokeless tobacco: Never Used  . Alcohol Use: No   OB History    No data available     Review of Systems  Denies: HEADACHE, NAUSEA, ABDOMINAL PAIN,  CONGESTION, DYSURIA, SHORTNESS OF BREATH  Allergies  Bactrim  Home Medications   Prior to Admission medications   Medication Sig Start Date End Date Taking? Authorizing Provider  abatacept (ORENCIA) 250 MG  injection Inject 250 mg into the vein every 28 (twenty-eight) days. Reported on 12/03/2015    Historical Provider, MD  amitriptyline (ELAVIL) 25 MG tablet TK 1 TO 2 TS PO QHS 11/15/15   Historical Provider, MD  aspirin 81 MG EC tablet Take 81 mg by mouth daily.     Historical Provider, MD  Cyanocobalamin (VITAMIN B-12) 1000 MCG/15ML LIQD Take 10 mLs by mouth daily.     Historical Provider, MD  donepezil (ARICEPT) 5 MG tablet Take 5 mg by mouth daily with supper.  06/03/15   Historical Provider, MD  gabapentin (NEURONTIN) 300 MG capsule Take 600 mg by mouth 2 (two) times daily.     Historical Provider, MD  levothyroxine (SYNTHROID, LEVOTHROID) 50 MCG tablet TK 1 T PO QD 09/13/15   Historical Provider, MD  losartan-hydrochlorothiazide (HYZAAR) 100-12.5 MG per tablet Take 1 tablet by mouth daily.     Historical Provider, MD  metFORMIN (GLUCOPHAGE-XR) 500 MG 24 hr tablet Take 500 mg by mouth 2 (two) times daily. 07/20/15   Historical Provider, MD  metoprolol (LOPRESSOR) 50 MG tablet Take 50 mg by mouth 2 (two) times daily.    Historical Provider, MD  Multiple Vitamins-Minerals (CENTRUM SILVER PO) Take 1 tablet by mouth daily.      Historical Provider, MD  OVER THE COUNTER MEDICATION Take 1 tablet by mouth daily. Nature Made Bone Support with  Vit. D3 Gummy    Historical Provider, MD  OVER THE COUNTER MEDICATION Take 2 tablets by mouth daily. Nature Made Hair Skin and Nails Gummy    Historical Provider, MD  pravastatin (PRAVACHOL) 40 MG tablet Take 40 mg by mouth daily.  03/15/14   Historical Provider, MD   Meds Ordered and Administered this Visit  Medications - No data to display  BP 121/68 mmHg  Pulse 57  Temp(Src) 98 F (36.7 C) (Oral)  Resp 16  Ht 5\' 5"  (1.651 m)  Wt 215 lb (97.523 kg)  BMI 35.78 kg/m2  SpO2 98% No data found.   Physical Exam NURSES NOTES AND VITAL SIGNS REVIEWED. CONSTITUTIONAL: Well developed, well nourished, no acute distress HEENT: normocephalic, atraumatic EYES:  Conjunctiva normal NECK:normal ROM, supple, no adenopathy PULMONARY:No respiratory distress, normal effort, point tender chest wall pain, right sternocostal margin 5-6. No rash ABDOMINAL: Soft, ND, NT BS+, No CVAT MUSCULOSKELETAL: Normal ROM of all extremities,  SKIN: warm and dry without rash PSYCHIATRIC: Mood and affect, behavior are normal  ED Course  Procedures (including critical care time)  Labs Review Labs Reviewed - No data to display  Imaging Review No results found.   Visual Acuity Review  Right Eye Distance:   Left Eye Distance:   Bilateral Distance:    Right Eye Near:   Left Eye Near:    Bilateral Near:       Symptomatic treatment with ibuprofen and heat.  Keep appt with PCP in June.   MDM   1. Acute costochondritis     Patient is reassured that there are no issues that require transfer to higher level of care at this time or additional tests. Patient is advised to continue home symptomatic treatment. Patient is advised that if there are new or worsening symptoms to attend the emergency department, contact primary care provider, or return to UC. Instructions of care provided discharged home in stable condition.    THIS NOTE WAS GENERATED USING A VOICE RECOGNITION SOFTWARE PROGRAM. ALL REASONABLE EFFORTS  WERE MADE TO PROOFREAD THIS DOCUMENT FOR ACCURACY.  I have verbally reviewed the discharge instructions with the patient. A printed AVS was given to the patient.  All questions were answered prior to discharge.      July, PA 04/27/16 [phone]

## 2016-04-27 NOTE — ED Notes (Signed)
PT reports right "dull" aching chest pain that started yesterday while watching TV. PT reports pain extends into her back. PT's chest pain is worse when right chest is palpated. PT is unaware of injury or muscle strain. PT has no cardiac history. PT denies any accompanying symptoms like SOB, nausea, or sweating.

## 2016-04-27 NOTE — Discharge Instructions (Signed)
Costochondritis  Costochondritis is a condition in which the tissue (cartilage) that connects your ribs with your breastbone (sternum) becomes irritated. It causes pain in the chest and rib area. It usually goes away on its own over time.  HOME CARE  · Avoid activities that wear you out.  · Do not strain your ribs. Avoid activities that use your:    Chest.    Belly.    Side muscles.  · Put ice on the area for the first 2 days after the pain starts.    Put ice in a plastic bag.    Place a towel between your skin and the bag.    Leave the ice on for 20 minutes, 2-3 times a day.  · Only take medicine as told by your doctor.  GET HELP IF:  · You have redness or puffiness (swelling) in the rib area.  · Your pain does not go away with rest or medicine.  GET HELP RIGHT AWAY IF:   · Your pain gets worse.  · You are very uncomfortable.  · You have trouble breathing.  · You cough up blood.  · You start sweating or throwing up (vomiting).  · You have a fever or lasting symptoms for more than 2-3 days.  · You have a fever and your symptoms suddenly get worse.  MAKE SURE YOU:   · Understand these instructions.  · Will watch your condition.  · Will get help right away if you are not doing well or get worse.     This information is not intended to replace advice given to you by your health care provider. Make sure you discuss any questions you have with your health care provider.     Document Released: 05/03/2008 Document Revised: 07/18/2013 Document Reviewed: 06/19/2013  Elsevier Interactive Patient Education ©2016 [employer] Inc[employer]

## 2016-04-27 NOTE — ED Notes (Signed)
Barbra Sarks, PA made aware of PT complaint. No EKG needed at this time per Barbra Sarks, PA

## 2016-04-27 NOTE — ED Notes (Signed)
PT discharged by Frank Patrick, PA 

## 2016-04-30 DIAGNOSIS — Z6836 Body mass index (BMI) 36.0-36.9, adult: Secondary | ICD-10-CM | POA: Diagnosis not present

## 2016-04-30 DIAGNOSIS — I7389 Other specified peripheral vascular diseases: Secondary | ICD-10-CM | POA: Diagnosis not present

## 2016-04-30 DIAGNOSIS — E1149 Type 2 diabetes mellitus with other diabetic neurological complication: Secondary | ICD-10-CM | POA: Diagnosis not present

## 2016-04-30 DIAGNOSIS — E782 Mixed hyperlipidemia: Secondary | ICD-10-CM | POA: Diagnosis not present

## 2016-04-30 DIAGNOSIS — I10 Essential (primary) hypertension: Secondary | ICD-10-CM | POA: Diagnosis not present

## 2016-04-30 DIAGNOSIS — E038 Other specified hypothyroidism: Secondary | ICD-10-CM | POA: Diagnosis not present

## 2016-04-30 DIAGNOSIS — M069 Rheumatoid arthritis, unspecified: Secondary | ICD-10-CM | POA: Diagnosis not present

## 2016-04-30 DIAGNOSIS — G4733 Obstructive sleep apnea (adult) (pediatric): Secondary | ICD-10-CM | POA: Diagnosis not present

## 2016-04-30 DIAGNOSIS — Z0000 Encounter for general adult medical examination without abnormal findings: Secondary | ICD-10-CM | POA: Diagnosis not present

## 2016-04-30 DIAGNOSIS — M109 Gout, unspecified: Secondary | ICD-10-CM | POA: Diagnosis not present

## 2016-04-30 DIAGNOSIS — F0390 Unspecified dementia without behavioral disturbance: Secondary | ICD-10-CM | POA: Diagnosis not present

## 2016-04-30 DIAGNOSIS — Z1389 Encounter for screening for other disorder: Secondary | ICD-10-CM | POA: Diagnosis not present

## 2016-05-05 DIAGNOSIS — M0579 Rheumatoid arthritis with rheumatoid factor of multiple sites without organ or systems involvement: Secondary | ICD-10-CM | POA: Diagnosis not present

## 2016-05-05 DIAGNOSIS — M0589 Other rheumatoid arthritis with rheumatoid factor of multiple sites: Secondary | ICD-10-CM | POA: Diagnosis not present

## 2016-05-19 ENCOUNTER — Ambulatory Visit
Admission: RE | Admit: 2016-05-19 | Discharge: 2016-05-19 | Disposition: A | Discharge status: Home / Self Care | Payer: Medicare Other | Source: Ambulatory Visit

## 2016-05-19 DIAGNOSIS — Z1231 Encounter for screening mammogram for malignant neoplasm of breast: Secondary | ICD-10-CM | POA: Diagnosis not present

## 2016-06-02 DIAGNOSIS — M0579 Rheumatoid arthritis with rheumatoid factor of multiple sites without organ or systems involvement: Secondary | ICD-10-CM | POA: Diagnosis not present

## 2016-06-30 DIAGNOSIS — M0579 Rheumatoid arthritis with rheumatoid factor of multiple sites without organ or systems involvement: Secondary | ICD-10-CM | POA: Diagnosis not present

## 2016-07-13 DIAGNOSIS — E119 Type 2 diabetes mellitus without complications: Secondary | ICD-10-CM | POA: Diagnosis not present

## 2016-07-13 DIAGNOSIS — H524 Presbyopia: Secondary | ICD-10-CM | POA: Diagnosis not present

## 2016-07-28 DIAGNOSIS — M0579 Rheumatoid arthritis with rheumatoid factor of multiple sites without organ or systems involvement: Secondary | ICD-10-CM | POA: Diagnosis not present

## 2016-08-09 DIAGNOSIS — E1149 Type 2 diabetes mellitus with other diabetic neurological complication: Secondary | ICD-10-CM | POA: Diagnosis not present

## 2016-08-09 DIAGNOSIS — Z6835 Body mass index (BMI) 35.0-35.9, adult: Secondary | ICD-10-CM | POA: Diagnosis not present

## 2016-08-09 DIAGNOSIS — G4733 Obstructive sleep apnea (adult) (pediatric): Secondary | ICD-10-CM | POA: Diagnosis not present

## 2016-08-09 DIAGNOSIS — I10 Essential (primary) hypertension: Secondary | ICD-10-CM | POA: Diagnosis not present

## 2016-08-09 DIAGNOSIS — E038 Other specified hypothyroidism: Secondary | ICD-10-CM | POA: Diagnosis not present

## 2016-08-16 DIAGNOSIS — Z23 Encounter for immunization: Secondary | ICD-10-CM | POA: Diagnosis not present

## 2016-08-25 DIAGNOSIS — M0579 Rheumatoid arthritis with rheumatoid factor of multiple sites without organ or systems involvement: Secondary | ICD-10-CM | POA: Diagnosis not present

## 2016-09-22 DIAGNOSIS — R5383 Other fatigue: Secondary | ICD-10-CM | POA: Diagnosis not present

## 2016-09-22 DIAGNOSIS — M0589 Other rheumatoid arthritis with rheumatoid factor of multiple sites: Secondary | ICD-10-CM | POA: Diagnosis not present

## 2016-09-22 DIAGNOSIS — M0579 Rheumatoid arthritis with rheumatoid factor of multiple sites without organ or systems involvement: Secondary | ICD-10-CM | POA: Diagnosis not present

## 2016-10-20 DIAGNOSIS — M0579 Rheumatoid arthritis with rheumatoid factor of multiple sites without organ or systems involvement: Secondary | ICD-10-CM | POA: Diagnosis not present

## 2016-11-17 DIAGNOSIS — M0579 Rheumatoid arthritis with rheumatoid factor of multiple sites without organ or systems involvement: Secondary | ICD-10-CM | POA: Diagnosis not present

## 2016-12-01 IMAGING — DX DG OS CALCIS 2+V*R*
3 series · 3 of 3 positions shown · non-contrast
Comparison: None.

CLINICAL DATA: Right heel pain, no known injury

EXAM:
RIGHT OS CALCIS - 2+ VIEW

[calcaneus axial (1 of 2)]
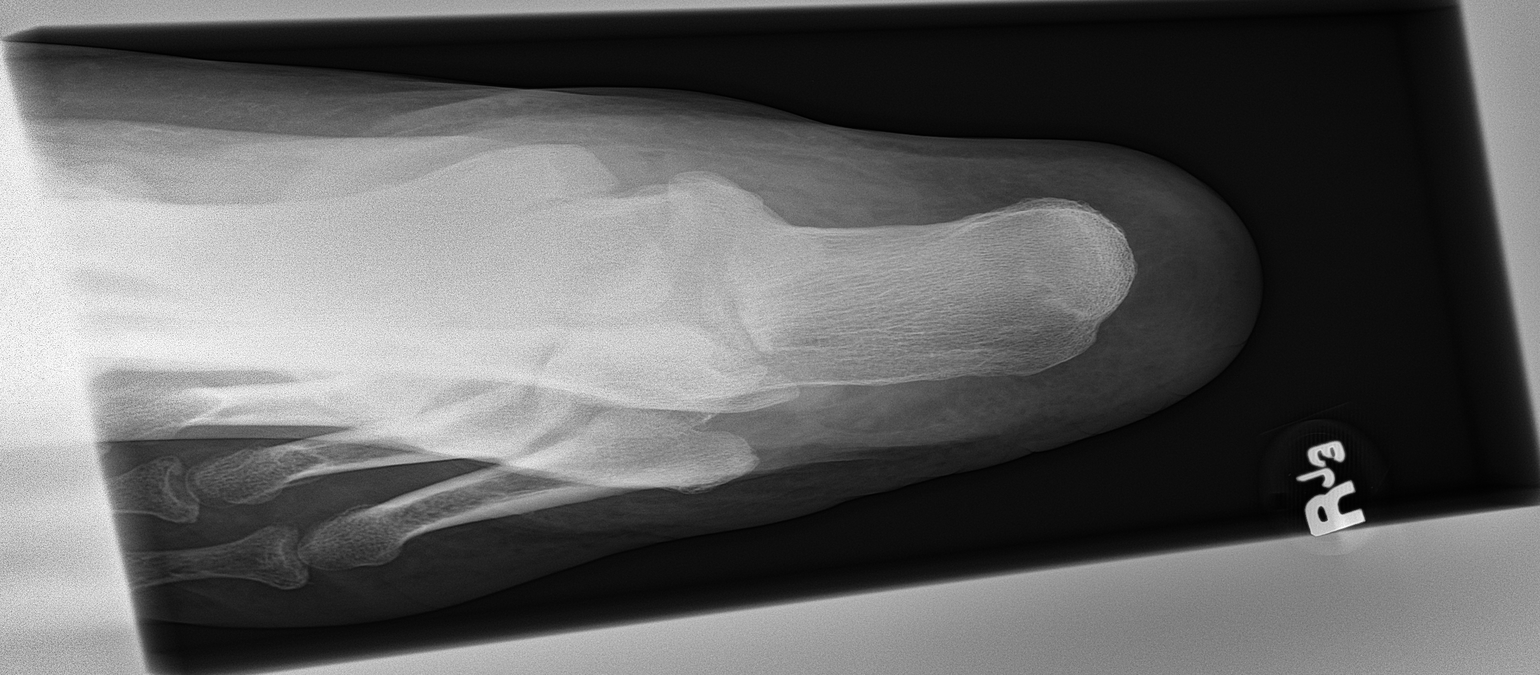

[calcaneus lat]
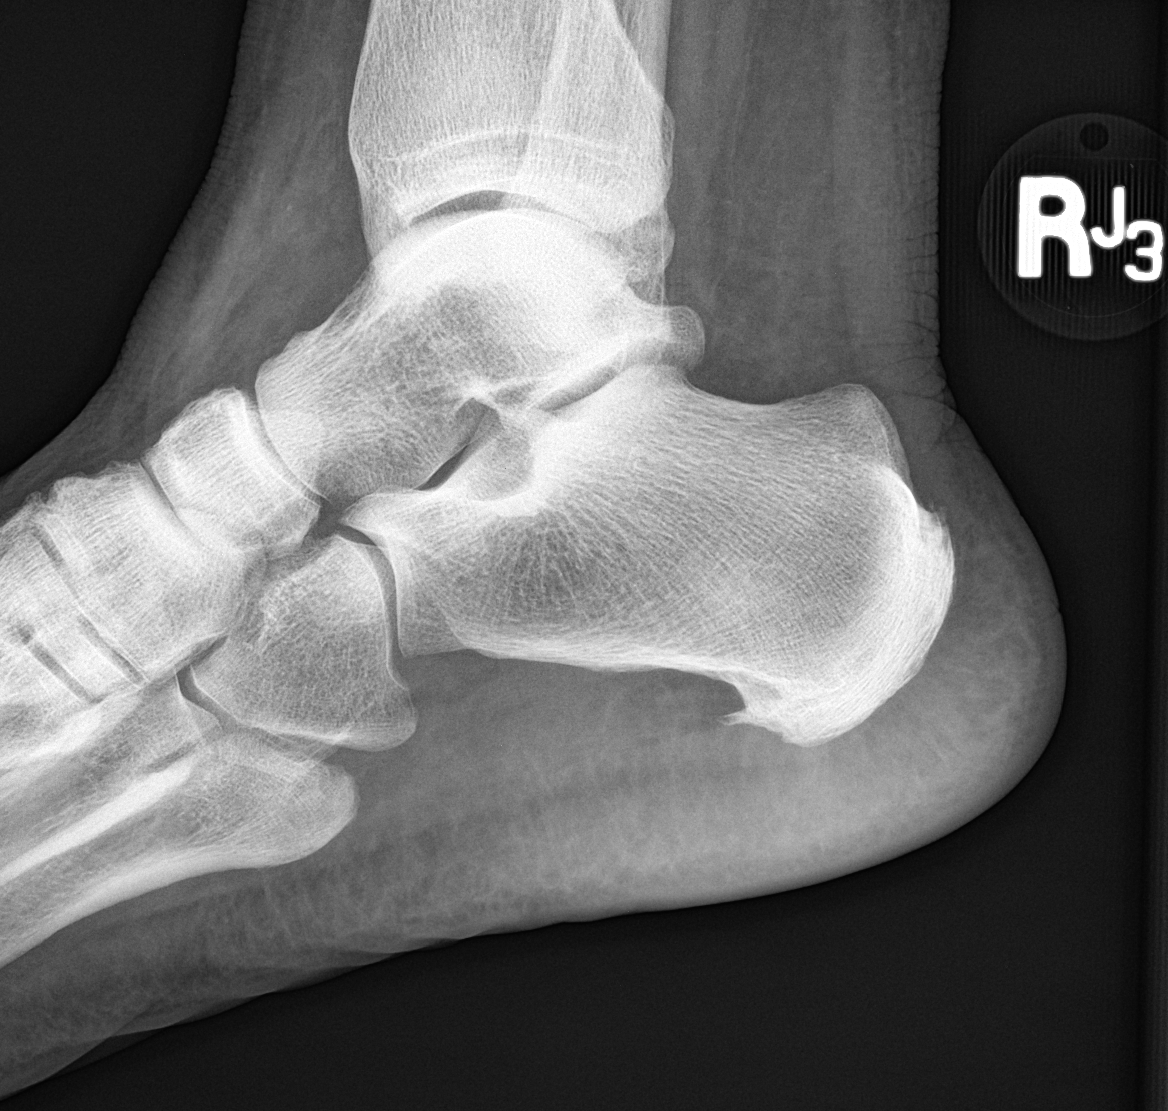

[calcaneus axial (2 of 2)]
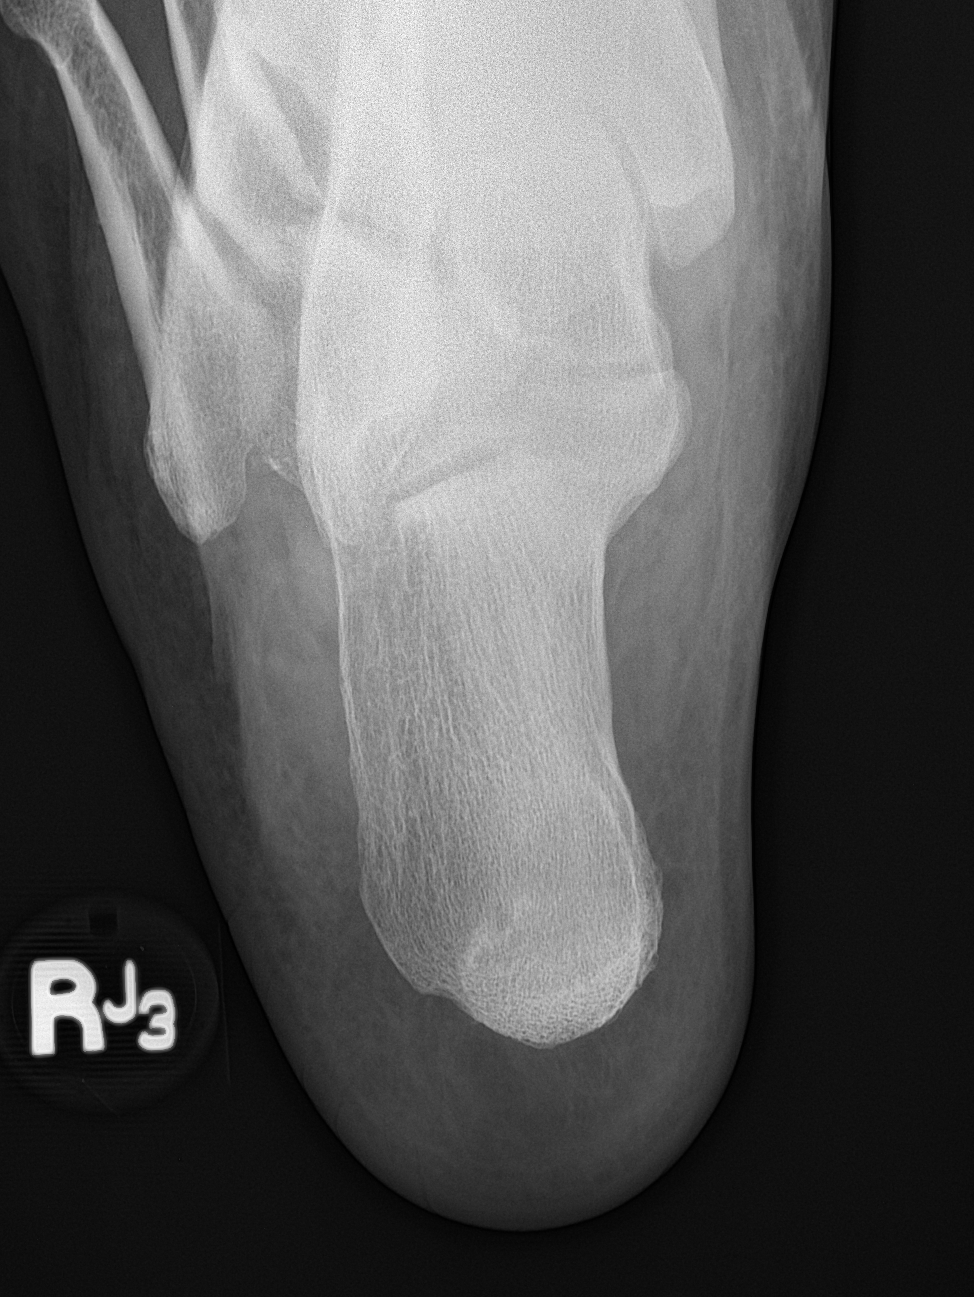

[3 of 3 positions shown; findings below may reference images not displayed]

FINDINGS: No acute fracture is noted. Small plantar spur is identified. No
gross soft tissue abnormality is seen.
IMPRESSION: Small plantar spur.  No acute bony abnormality is noted.

## 2016-12-06 DIAGNOSIS — Z23 Encounter for immunization: Secondary | ICD-10-CM | POA: Diagnosis not present

## 2016-12-21 DIAGNOSIS — M0579 Rheumatoid arthritis with rheumatoid factor of multiple sites without organ or systems involvement: Secondary | ICD-10-CM | POA: Diagnosis not present

## 2017-02-09 DIAGNOSIS — Z79899 Other long term (current) drug therapy: Secondary | ICD-10-CM | POA: Diagnosis not present

## 2017-02-09 DIAGNOSIS — M0589 Other rheumatoid arthritis with rheumatoid factor of multiple sites: Secondary | ICD-10-CM | POA: Diagnosis not present

## 2017-02-09 DIAGNOSIS — M0579 Rheumatoid arthritis with rheumatoid factor of multiple sites without organ or systems involvement: Secondary | ICD-10-CM | POA: Diagnosis not present

## 2017-02-09 DIAGNOSIS — R413 Other amnesia: Secondary | ICD-10-CM | POA: Diagnosis not present

## 2017-03-09 DIAGNOSIS — M0579 Rheumatoid arthritis with rheumatoid factor of multiple sites without organ or systems involvement: Secondary | ICD-10-CM | POA: Diagnosis not present

## 2017-03-12 DIAGNOSIS — Z23 Encounter for immunization: Secondary | ICD-10-CM | POA: Diagnosis not present

## 2017-04-04 ENCOUNTER — Encounter (HOSPITAL_COMMUNITY): Payer: Self-pay | Admitting: Emergency Medicine

## 2017-04-04 ENCOUNTER — Emergency Department (HOSPITAL_COMMUNITY): Payer: Medicare Other

## 2017-04-04 ENCOUNTER — Emergency Department (HOSPITAL_COMMUNITY)
Admission: EM | Admit: 2017-04-04 | Discharge: 2017-04-04 | Disposition: A | Discharge status: Home / Self Care | Payer: Medicare Other | Attending: Emergency Medicine | Admitting: Emergency Medicine

## 2017-04-04 DIAGNOSIS — I10 Essential (primary) hypertension: Secondary | ICD-10-CM | POA: Insufficient documentation

## 2017-04-04 DIAGNOSIS — Z7984 Long term (current) use of oral hypoglycemic drugs: Secondary | ICD-10-CM | POA: Insufficient documentation

## 2017-04-04 DIAGNOSIS — M62830 Muscle spasm of back: Secondary | ICD-10-CM | POA: Diagnosis not present

## 2017-04-04 DIAGNOSIS — M545 Low back pain, unspecified: Secondary | ICD-10-CM

## 2017-04-04 DIAGNOSIS — E119 Type 2 diabetes mellitus without complications: Secondary | ICD-10-CM | POA: Insufficient documentation

## 2017-04-04 DIAGNOSIS — M549 Dorsalgia, unspecified: Secondary | ICD-10-CM | POA: Diagnosis present

## 2017-04-04 DIAGNOSIS — M1611 Unilateral primary osteoarthritis, right hip: Secondary | ICD-10-CM | POA: Diagnosis not present

## 2017-04-04 DIAGNOSIS — J45909 Unspecified asthma, uncomplicated: Secondary | ICD-10-CM | POA: Diagnosis not present

## 2017-04-04 DIAGNOSIS — Z7982 Long term (current) use of aspirin: Secondary | ICD-10-CM | POA: Diagnosis not present

## 2017-04-04 LAB — I-STAT CHEM 8, ED
BUN: 12 mg/dL (ref 6–20)
CALCIUM ION: 1.23 mmol/L (ref 1.15–1.40)
Chloride: 102 mmol/L (ref 101–111)
Creatinine, Ser: 0.9 mg/dL (ref 0.44–1.00)
Glucose, Bld: 87 mg/dL (ref 65–99)
HCT: 38 % (ref 36.0–46.0)
Hemoglobin: 12.9 g/dL (ref 12.0–15.0)
Potassium: 4.2 mmol/L (ref 3.5–5.1)
Sodium: 140 mmol/L (ref 135–145)
TCO2: 30 mmol/L (ref 0–100)

## 2017-04-04 LAB — URINALYSIS, ROUTINE W REFLEX MICROSCOPIC
Bilirubin Urine: NEGATIVE
GLUCOSE, UA: NEGATIVE mg/dL
HGB URINE DIPSTICK: NEGATIVE
Ketones, ur: NEGATIVE mg/dL
Leukocytes, UA: NEGATIVE
Nitrite: NEGATIVE
PH: 6 (ref 5.0–8.0)
PROTEIN: NEGATIVE mg/dL
Specific Gravity, Urine: 1.016 (ref 1.005–1.030)

## 2017-04-04 MED ORDER — METHOCARBAMOL 500 MG PO TABS: 500.0000 mg | ORAL_TABLET | Freq: Three times a day (TID) | ORAL | 0 refills | Status: AC | PRN

## 2017-04-04 MED ORDER — IBUPROFEN 200 MG PO TABS
400.0000 mg | ORAL_TABLET | Freq: Once | ORAL | Status: AC
  Administered 2017-04-04: 400 mg via ORAL
  Filled 2017-04-04: qty 2

## 2017-04-04 MED ORDER — METHOCARBAMOL 500 MG PO TABS
500.0000 mg | ORAL_TABLET | Freq: Once | ORAL | Status: AC
  Administered 2017-04-04: 500 mg via ORAL
  Filled 2017-04-04: qty 1

## 2017-04-04 MED ORDER — IBUPROFEN 400 MG PO TABS: 400.0000 mg | ORAL_TABLET | Freq: Four times a day (QID) | ORAL | 0 refills | Status: AC | PRN

## 2017-04-04 NOTE — ED Notes (Signed)
Bed: WA03 Expected date:  Expected time:  Means of arrival:  Comments: 

## 2017-04-04 NOTE — Discharge Instructions (Signed)
The Xrays of the hip show arthritis and there is some arthritis of the spine.   The workup in the ER is not complete, and is limited to screening for life threatening and emergent conditions only, so please see a primary care doctor for further evaluation.  Please return to the ER if your symptoms worsen; you have increased pain, fevers, chills, inability to keep any medications down, inability to ambulate

## 2017-04-04 NOTE — ED Provider Notes (Addendum)
[facility]-EMERGENCY DEPT Provider Note   CSN: [id number] Arrival date & time: 04/04/17  0944     History   Chief Complaint Chief Complaint  Patient presents with  . Back Pain    HPI Jenna Foster is a 76 y.o. female.  HPI Pt with hx of HTN, HL comes in with cc of back pain. Pt reports that her current pain started about a week ago and is located on the bilateral flank region, R worse than L. Pt reports that the pain is worse when she moves or walks. She was recently treated for cystitis. Pt has no numbness, tingling, leg weakness and denies any known hx of back disease.  Records also reveal that pt has RA for which she gets infusion. PT denies n/v/f/c. Pt is ambulating with pain.   Past Medical History:  Diagnosis Date  . Arthritis    Rheumatoid Arthritis-  infusion tx. every 4 weeks,next 10-29-15 .  Marland Kitchen Asthma   . Diabetes mellitus   . DVT (deep vein thrombosis) in pregnancy [facility])    leg- '63-left foot tends to swell.  . Hyperlipidemia   . Hypertension   . Neuromuscular disorder (HCC)    neuropapthy-bilateral  . OSA (obstructive sleep apnea)    on CPAP   . 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 Upper airway cough syndrome 04/03/2014  . DM 01/28/2011  . IRREGULAR HEART RATE 01/28/2011  . Rheumatoid arthritis (HCC) 01/27/2011  . 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 OBESITY, NOS 01/26/2007  . HYPERTENSION, BENIGN SYSTEMIC 01/26/2007  . RHINITIS, ALLERGIC 01/26/2007  . REFLUX ESOPHAGITIS 01/26/2007  . SHOULDER, PAINFUL, ARTHRALGIA 01/26/2007    Past Surgical History:  Procedure Laterality Date  . ABDOMINAL HYSTERECTOMY  1964  . BREAST SURGERY Left     left breast cystectomy  . CHOLECYSTECTOMY  1965  . COLONOSCOPY WITH PROPOFOL N/A 10/21/2015   Procedure: COLONOSCOPY WITH PROPOFOL;  Surgeon: Charna Elizabeth, MD;  Location: [facility] ENDOSCOPY;  Service: Endoscopy;  Laterality: N/A;    OB History      No data available       Home Medications    Prior to Admission medications   Medication Sig Start Date End Date Taking? Authorizing Provider  aspirin 81 MG EC tablet Take 81 mg by mouth daily.    Yes [provider]  gabapentin (NEURONTIN) 300 MG capsule Take 600 mg by mouth 2 (two) times daily.    Yes [provider]  metoprolol (LOPRESSOR) 50 MG tablet Take 50 mg by mouth 2 (two) times daily.   Yes [provider]  pravastatin (PRAVACHOL) 40 MG tablet Take 40 mg by mouth daily.  03/15/14  Yes [provider]  sitaGLIPtin-metformin (JANUMET) 50-500 MG tablet Take 1 tablet by mouth 2 (two) times daily with a meal.   Yes [provider]  abatacept (ORENCIA) 250 MG injection Inject 250 mg into the vein every 28 (twenty-eight) days. Reported on 12/03/2015    [provider]  amitriptyline (ELAVIL) 25 MG tablet TK 1 TO 2 TS PO QHS 11/15/15   [provider]  Cyanocobalamin (VITAMIN B-12) 1000 MCG/15ML LIQD Take 10 mLs by mouth daily.     [provider]  donepezil (ARICEPT) 5 MG tablet Take 5 mg by mouth daily with supper.  06/03/15   [provider]  ibuprofen (ADVIL,MOTRIN) 400 MG tablet Take 1 tablet (400 mg total) by mouth  every 6 (six) hours as needed. 04/04/17   Derwood Kaplan, MD  levothyroxine (SYNTHROID, LEVOTHROID) 50 MCG tablet TK 1 T PO QD 09/13/15   [provider]  losartan-hydrochlorothiazide (HYZAAR) 100-12.5 MG per tablet Take 1 tablet by mouth daily.     [provider]  metFORMIN (GLUCOPHAGE-XR) 500 MG 24 hr tablet Take 500 mg by mouth 2 (two) times daily. 07/20/15   [provider]  methocarbamol (ROBAXIN) 500 MG tablet Take 1 tablet (500 mg total) by mouth every 8 (eight) hours as needed for muscle spasms. 04/04/17   Derwood Kaplan, MD  Multiple Vitamins-Minerals (CENTRUM SILVER PO) Take 1 tablet by mouth daily.      [provider]  OVER THE COUNTER MEDICATION Take  1 tablet by mouth daily. Nature Made Bone Support with Vit. D3 Gummy    [provider]  OVER THE COUNTER MEDICATION Take 2 tablets by mouth daily. Nature Made Hair Skin and Nails Gummy    [provider]    Family History Family History  Problem Relation Age of Onset  . Diabetes Mother   . Hypertension Mother   . Alzheimer's disease Mother   . Stomach cancer Sister     Social History Social History  Substance Use Topics  . Smoking status: Never Smoker  . Smokeless tobacco: Never Used  . Alcohol use No     Allergies   Bactrim   Review of Systems Review of Systems  Constitutional: Positive for activity change.  Musculoskeletal: Positive for arthralgias and gait problem.  Skin: Negative for rash.  Allergic/Immunologic: Positive for immunocompromised state.     Physical Exam Updated Vital Signs BP (!) 118/57 (BP Location: Right Arm)   Pulse (!) 54   Temp 98.2 F (36.8 C) (Oral)   Resp 18   SpO2 96%   Physical Exam  Constitutional: She is oriented to person, place, and time. She appears well-developed and well-nourished.  HENT:  Head: Normocephalic and atraumatic.  Eyes: EOM are normal. Pupils are equal, round, and reactive to light.  Neck: Neck supple.  Cardiovascular: Normal rate, regular rhythm and normal heart sounds.   No murmur heard. Pulmonary/Chest: Effort normal. No respiratory distress.  Abdominal: Soft. She exhibits no distension. There is no tenderness. There is no rebound and no guarding.  Musculoskeletal:  Pt has no tenderness with bilateral hip flexion and internal + external rotation. Pt has reproducible tenderness over the lumbar paraspinal region. She also has large spasm - and the tenderness is reproduced with palpation over the spasm.  Neurological: She is alert and oriented to person, place, and time.  Skin: Skin is warm and dry.  Nursing note and vitals reviewed.    ED Treatments / Results  Labs (all labs ordered are  listed, but only abnormal results are displayed) Labs Reviewed  URINALYSIS, ROUTINE W REFLEX MICROSCOPIC - Abnormal; Notable for the following:       Result Value   APPearance HAZY (*)    All other components within normal limits  I-STAT CHEM 8, ED    EKG  EKG Interpretation None       Radiology Dg Hip Unilat W Or Wo Pelvis 2-3 Views Right  Result Date: 04/04/2017 CLINICAL DATA:  Right hip pain for 1 week EXAM: DG HIP (WITH OR WITHOUT PELVIS) 2-3V RIGHT COMPARISON:  None available FINDINGS: Mild degenerative changes noted of the lower lumbar spine, SI joints and both hips. Bony pelvis appears intact. Dedicated right hip views demonstrate no acute  fracture or malalignment. Normal bowel gas pattern. IMPRESSION: No acute osseous finding.  Degenerative changes as above. Electronically Signed   By: Judie Petit.  Shick M.D.   On: 04/04/2017 15:18    Procedures Procedures (including critical care time)  Medications Ordered in ED Medications  ibuprofen (ADVIL,MOTRIN) tablet 400 mg (400 mg Oral Given 04/04/17 1351)  methocarbamol (ROBAXIN) tablet 500 mg (500 mg Oral Given 04/04/17 1351)     Initial Impression / Assessment and Plan / ED Course  I have reviewed the triage vital signs and the nursing notes.  Pertinent labs & imaging results that were available during my care of the patient were reviewed by me and considered in my medical decision making (see chart for details).  Clinical Course as of Apr 04 1616  Mon Apr 04, 2017  1616 Results from the ER workup discussed with the patient face to face and all questions answered to the best of my ability.  DG Hip Unilat W or Wo Pelvis 2-3 Views Right [AN]  1616 Strict ER return precautions have been discussed, and patient is agreeing with the plan and is comfortable with the workup done and the recommendations from the ER.   [AN]    Clinical Course User Index [AN] Derwood Kaplan, MD    Pt comes in with cc of hip pain / back pain. Back pain is  bilateral, but worse over the R flank region. Based on the exam - there is no evidence of septic arthritis, as pt's ROM is normal for the hip. WE noted significant spasm of the back muscles, and that is likely contributing. Anticipate d/c. Xrays ordered to ensure there is no lytic lesions.  Final Clinical Impressions(s) / ED Diagnoses   Final diagnoses:  Acute bilateral low back pain without sciatica  Muscle spasm of back    New Prescriptions New Prescriptions   IBUPROFEN (ADVIL,MOTRIN) 400 MG TABLET    Take 1 tablet (400 mg total) by mouth every 6 (six) hours as needed.   METHOCARBAMOL (ROBAXIN) 500 MG TABLET    Take 1 tablet (500 mg total) by mouth every 8 (eight) hours as needed for muscle spasms.     Derwood Kaplan, MD 04/04/17 1441    Derwood Kaplan, MD 04/04/17 [phone]

## 2017-04-04 NOTE — ED Triage Notes (Signed)
Pt verbalizes currently getting treated for UTI diagnosed last week but requesting evaluation related to worsening lower back pain.

## 2017-04-04 NOTE — ED Notes (Signed)
Pt states she is not here for urinary symptoms, she is here for back pain.  States there have been no urinary changes.

## 2017-04-06 DIAGNOSIS — M0579 Rheumatoid arthritis with rheumatoid factor of multiple sites without organ or systems involvement: Secondary | ICD-10-CM | POA: Diagnosis not present

## 2017-05-04 DIAGNOSIS — E782 Mixed hyperlipidemia: Secondary | ICD-10-CM | POA: Diagnosis not present

## 2017-05-04 DIAGNOSIS — E038 Other specified hypothyroidism: Secondary | ICD-10-CM | POA: Diagnosis not present

## 2017-05-04 DIAGNOSIS — M109 Gout, unspecified: Secondary | ICD-10-CM | POA: Diagnosis not present

## 2017-05-04 DIAGNOSIS — E1151 Type 2 diabetes mellitus with diabetic peripheral angiopathy without gangrene: Secondary | ICD-10-CM | POA: Diagnosis not present

## 2017-05-04 DIAGNOSIS — I10 Essential (primary) hypertension: Secondary | ICD-10-CM | POA: Diagnosis not present

## 2017-05-10 DIAGNOSIS — M069 Rheumatoid arthritis, unspecified: Secondary | ICD-10-CM | POA: Diagnosis not present

## 2017-05-10 DIAGNOSIS — E1140 Type 2 diabetes mellitus with diabetic neuropathy, unspecified: Secondary | ICD-10-CM | POA: Diagnosis not present

## 2017-05-10 DIAGNOSIS — F0390 Unspecified dementia without behavioral disturbance: Secondary | ICD-10-CM | POA: Diagnosis not present

## 2017-05-10 DIAGNOSIS — Z6837 Body mass index (BMI) 37.0-37.9, adult: Secondary | ICD-10-CM | POA: Diagnosis not present

## 2017-05-10 DIAGNOSIS — E1149 Type 2 diabetes mellitus with other diabetic neurological complication: Secondary | ICD-10-CM | POA: Diagnosis not present

## 2017-05-10 DIAGNOSIS — Z1389 Encounter for screening for other disorder: Secondary | ICD-10-CM | POA: Diagnosis not present

## 2017-05-10 DIAGNOSIS — I7389 Other specified peripheral vascular diseases: Secondary | ICD-10-CM | POA: Diagnosis not present

## 2017-05-10 DIAGNOSIS — E038 Other specified hypothyroidism: Secondary | ICD-10-CM | POA: Diagnosis not present

## 2017-05-10 DIAGNOSIS — G4733 Obstructive sleep apnea (adult) (pediatric): Secondary | ICD-10-CM | POA: Diagnosis not present

## 2017-05-10 DIAGNOSIS — M109 Gout, unspecified: Secondary | ICD-10-CM | POA: Diagnosis not present

## 2017-05-10 DIAGNOSIS — Z0000 Encounter for general adult medical examination without abnormal findings: Secondary | ICD-10-CM | POA: Diagnosis not present

## 2017-05-10 DIAGNOSIS — R600 Localized edema: Secondary | ICD-10-CM | POA: Diagnosis not present

## 2017-05-11 DIAGNOSIS — R413 Other amnesia: Secondary | ICD-10-CM | POA: Diagnosis not present

## 2017-05-11 DIAGNOSIS — M0579 Rheumatoid arthritis with rheumatoid factor of multiple sites without organ or systems involvement: Secondary | ICD-10-CM | POA: Diagnosis not present

## 2017-05-11 DIAGNOSIS — M7989 Other specified soft tissue disorders: Secondary | ICD-10-CM | POA: Diagnosis not present

## 2017-05-11 DIAGNOSIS — Z79899 Other long term (current) drug therapy: Secondary | ICD-10-CM | POA: Diagnosis not present

## 2017-05-18 DIAGNOSIS — M0589 Other rheumatoid arthritis with rheumatoid factor of multiple sites: Secondary | ICD-10-CM | POA: Diagnosis not present

## 2017-05-18 DIAGNOSIS — M0579 Rheumatoid arthritis with rheumatoid factor of multiple sites without organ or systems involvement: Secondary | ICD-10-CM | POA: Diagnosis not present

## 2017-06-15 DIAGNOSIS — M0589 Other rheumatoid arthritis with rheumatoid factor of multiple sites: Secondary | ICD-10-CM | POA: Diagnosis not present

## 2017-06-15 DIAGNOSIS — Z79899 Other long term (current) drug therapy: Secondary | ICD-10-CM | POA: Diagnosis not present

## 2017-07-13 DIAGNOSIS — M0579 Rheumatoid arthritis with rheumatoid factor of multiple sites without organ or systems involvement: Secondary | ICD-10-CM | POA: Diagnosis not present

## 2017-08-10 DIAGNOSIS — M0579 Rheumatoid arthritis with rheumatoid factor of multiple sites without organ or systems involvement: Secondary | ICD-10-CM | POA: Diagnosis not present

## 2017-08-11 DIAGNOSIS — M7989 Other specified soft tissue disorders: Secondary | ICD-10-CM | POA: Diagnosis not present

## 2017-08-11 DIAGNOSIS — Z79899 Other long term (current) drug therapy: Secondary | ICD-10-CM | POA: Diagnosis not present

## 2017-08-11 DIAGNOSIS — M0579 Rheumatoid arthritis with rheumatoid factor of multiple sites without organ or systems involvement: Secondary | ICD-10-CM | POA: Diagnosis not present

## 2017-08-11 DIAGNOSIS — E119 Type 2 diabetes mellitus without complications: Secondary | ICD-10-CM | POA: Diagnosis not present

## 2017-08-16 DIAGNOSIS — E119 Type 2 diabetes mellitus without complications: Secondary | ICD-10-CM | POA: Diagnosis not present

## 2017-08-16 DIAGNOSIS — H524 Presbyopia: Secondary | ICD-10-CM | POA: Diagnosis not present

## 2017-09-05 DIAGNOSIS — Z6835 Body mass index (BMI) 35.0-35.9, adult: Secondary | ICD-10-CM | POA: Diagnosis not present

## 2017-09-05 DIAGNOSIS — E038 Other specified hypothyroidism: Secondary | ICD-10-CM | POA: Diagnosis not present

## 2017-09-05 DIAGNOSIS — Z23 Encounter for immunization: Secondary | ICD-10-CM | POA: Diagnosis not present

## 2017-09-05 DIAGNOSIS — M069 Rheumatoid arthritis, unspecified: Secondary | ICD-10-CM | POA: Diagnosis not present

## 2017-09-05 DIAGNOSIS — R634 Abnormal weight loss: Secondary | ICD-10-CM | POA: Diagnosis not present

## 2017-09-05 DIAGNOSIS — E1140 Type 2 diabetes mellitus with diabetic neuropathy, unspecified: Secondary | ICD-10-CM | POA: Diagnosis not present

## 2017-09-05 DIAGNOSIS — E1149 Type 2 diabetes mellitus with other diabetic neurological complication: Secondary | ICD-10-CM | POA: Diagnosis not present

## 2017-09-05 DIAGNOSIS — G4733 Obstructive sleep apnea (adult) (pediatric): Secondary | ICD-10-CM | POA: Diagnosis not present

## 2017-09-07 DIAGNOSIS — M0579 Rheumatoid arthritis with rheumatoid factor of multiple sites without organ or systems involvement: Secondary | ICD-10-CM | POA: Diagnosis not present

## 2017-10-05 DIAGNOSIS — M0589 Other rheumatoid arthritis with rheumatoid factor of multiple sites: Secondary | ICD-10-CM | POA: Diagnosis not present

## 2017-10-05 DIAGNOSIS — M0579 Rheumatoid arthritis with rheumatoid factor of multiple sites without organ or systems involvement: Secondary | ICD-10-CM | POA: Diagnosis not present

## 2017-10-05 DIAGNOSIS — R5383 Other fatigue: Secondary | ICD-10-CM | POA: Diagnosis not present

## 2017-11-02 DIAGNOSIS — M0579 Rheumatoid arthritis with rheumatoid factor of multiple sites without organ or systems involvement: Secondary | ICD-10-CM | POA: Diagnosis not present

## 2017-12-05 DIAGNOSIS — M0579 Rheumatoid arthritis with rheumatoid factor of multiple sites without organ or systems involvement: Secondary | ICD-10-CM | POA: Diagnosis not present

## 2017-12-14 DIAGNOSIS — M0579 Rheumatoid arthritis with rheumatoid factor of multiple sites without organ or systems involvement: Secondary | ICD-10-CM | POA: Diagnosis not present

## 2017-12-14 DIAGNOSIS — Z79899 Other long term (current) drug therapy: Secondary | ICD-10-CM | POA: Diagnosis not present

## 2017-12-14 DIAGNOSIS — E119 Type 2 diabetes mellitus without complications: Secondary | ICD-10-CM | POA: Diagnosis not present

## 2017-12-14 DIAGNOSIS — M7989 Other specified soft tissue disorders: Secondary | ICD-10-CM | POA: Diagnosis not present

## 2017-12-26 DIAGNOSIS — Z1389 Encounter for screening for other disorder: Secondary | ICD-10-CM | POA: Diagnosis not present

## 2017-12-26 DIAGNOSIS — E1140 Type 2 diabetes mellitus with diabetic neuropathy, unspecified: Secondary | ICD-10-CM | POA: Diagnosis not present

## 2017-12-26 DIAGNOSIS — G4733 Obstructive sleep apnea (adult) (pediatric): Secondary | ICD-10-CM | POA: Diagnosis not present

## 2017-12-26 DIAGNOSIS — I7389 Other specified peripheral vascular diseases: Secondary | ICD-10-CM | POA: Diagnosis not present

## 2017-12-26 DIAGNOSIS — E782 Mixed hyperlipidemia: Secondary | ICD-10-CM | POA: Diagnosis not present

## 2017-12-26 DIAGNOSIS — I10 Essential (primary) hypertension: Secondary | ICD-10-CM | POA: Diagnosis not present

## 2017-12-26 DIAGNOSIS — M069 Rheumatoid arthritis, unspecified: Secondary | ICD-10-CM | POA: Diagnosis not present

## 2017-12-26 DIAGNOSIS — E038 Other specified hypothyroidism: Secondary | ICD-10-CM | POA: Diagnosis not present

## 2017-12-26 DIAGNOSIS — E1149 Type 2 diabetes mellitus with other diabetic neurological complication: Secondary | ICD-10-CM | POA: Diagnosis not present

## 2017-12-26 DIAGNOSIS — Z6834 Body mass index (BMI) 34.0-34.9, adult: Secondary | ICD-10-CM | POA: Diagnosis not present

## 2018-01-02 DIAGNOSIS — M0589 Other rheumatoid arthritis with rheumatoid factor of multiple sites: Secondary | ICD-10-CM | POA: Diagnosis not present

## 2018-02-15 DIAGNOSIS — M0579 Rheumatoid arthritis with rheumatoid factor of multiple sites without organ or systems involvement: Secondary | ICD-10-CM | POA: Diagnosis not present

## 2018-02-15 DIAGNOSIS — M0589 Other rheumatoid arthritis with rheumatoid factor of multiple sites: Secondary | ICD-10-CM | POA: Diagnosis not present

## 2018-03-15 DIAGNOSIS — M0579 Rheumatoid arthritis with rheumatoid factor of multiple sites without organ or systems involvement: Secondary | ICD-10-CM | POA: Diagnosis not present

## 2018-06-15 DIAGNOSIS — E559 Vitamin D deficiency, unspecified: Secondary | ICD-10-CM | POA: Diagnosis not present

## 2018-06-15 DIAGNOSIS — E119 Type 2 diabetes mellitus without complications: Secondary | ICD-10-CM | POA: Diagnosis not present

## 2018-06-15 DIAGNOSIS — I10 Essential (primary) hypertension: Secondary | ICD-10-CM | POA: Diagnosis not present

## 2018-06-15 DIAGNOSIS — Z1389 Encounter for screening for other disorder: Secondary | ICD-10-CM | POA: Diagnosis not present

## 2018-06-15 DIAGNOSIS — Z90710 Acquired absence of both cervix and uterus: Secondary | ICD-10-CM | POA: Diagnosis not present

## 2018-06-15 DIAGNOSIS — Z6832 Body mass index (BMI) 32.0-32.9, adult: Secondary | ICD-10-CM | POA: Diagnosis not present

## 2018-06-15 DIAGNOSIS — E782 Mixed hyperlipidemia: Secondary | ICD-10-CM | POA: Diagnosis not present

## 2018-06-19 DIAGNOSIS — E119 Type 2 diabetes mellitus without complications: Secondary | ICD-10-CM | POA: Diagnosis not present

## 2018-06-19 DIAGNOSIS — I10 Essential (primary) hypertension: Secondary | ICD-10-CM | POA: Diagnosis not present

## 2018-06-19 DIAGNOSIS — Z6832 Body mass index (BMI) 32.0-32.9, adult: Secondary | ICD-10-CM | POA: Diagnosis not present

## 2018-06-19 DIAGNOSIS — E782 Mixed hyperlipidemia: Secondary | ICD-10-CM | POA: Diagnosis not present

## 2018-06-19 DIAGNOSIS — E559 Vitamin D deficiency, unspecified: Secondary | ICD-10-CM | POA: Diagnosis not present

## 2018-06-19 DIAGNOSIS — Z1389 Encounter for screening for other disorder: Secondary | ICD-10-CM | POA: Diagnosis not present

## 2018-06-26 DIAGNOSIS — E119 Type 2 diabetes mellitus without complications: Secondary | ICD-10-CM | POA: Diagnosis not present

## 2018-06-26 DIAGNOSIS — E782 Mixed hyperlipidemia: Secondary | ICD-10-CM | POA: Diagnosis not present

## 2018-06-26 DIAGNOSIS — E039 Hypothyroidism, unspecified: Secondary | ICD-10-CM | POA: Diagnosis not present

## 2018-07-17 DIAGNOSIS — Z6832 Body mass index (BMI) 32.0-32.9, adult: Secondary | ICD-10-CM | POA: Diagnosis not present

## 2018-07-17 DIAGNOSIS — Z0000 Encounter for general adult medical examination without abnormal findings: Secondary | ICD-10-CM | POA: Diagnosis not present

## 2018-07-21 DIAGNOSIS — H524 Presbyopia: Secondary | ICD-10-CM | POA: Diagnosis not present

## 2018-07-21 DIAGNOSIS — H4321 Crystalline deposits in vitreous body, right eye: Secondary | ICD-10-CM | POA: Diagnosis not present

## 2018-07-21 DIAGNOSIS — H25813 Combined forms of age-related cataract, bilateral: Secondary | ICD-10-CM | POA: Diagnosis not present

## 2018-07-21 DIAGNOSIS — H5203 Hypermetropia, bilateral: Secondary | ICD-10-CM | POA: Diagnosis not present

## 2018-07-21 DIAGNOSIS — E119 Type 2 diabetes mellitus without complications: Secondary | ICD-10-CM | POA: Diagnosis not present

## 2018-07-21 DIAGNOSIS — H52223 Regular astigmatism, bilateral: Secondary | ICD-10-CM | POA: Diagnosis not present

## 2018-07-27 DIAGNOSIS — Z23 Encounter for immunization: Secondary | ICD-10-CM | POA: Diagnosis not present

## 2018-07-27 DIAGNOSIS — I10 Essential (primary) hypertension: Secondary | ICD-10-CM | POA: Diagnosis not present

## 2018-07-27 DIAGNOSIS — Z6832 Body mass index (BMI) 32.0-32.9, adult: Secondary | ICD-10-CM | POA: Diagnosis not present

## 2018-07-27 DIAGNOSIS — R109 Unspecified abdominal pain: Secondary | ICD-10-CM | POA: Diagnosis not present

## 2018-07-27 DIAGNOSIS — E782 Mixed hyperlipidemia: Secondary | ICD-10-CM | POA: Diagnosis not present

## 2018-08-17 DIAGNOSIS — Z23 Encounter for immunization: Secondary | ICD-10-CM | POA: Diagnosis not present

## 2018-09-07 DIAGNOSIS — W228XXA Striking against or struck by other objects, initial encounter: Secondary | ICD-10-CM | POA: Diagnosis not present

## 2018-09-07 DIAGNOSIS — S8002XA Contusion of left knee, initial encounter: Secondary | ICD-10-CM | POA: Diagnosis not present

## 2018-09-07 DIAGNOSIS — I10 Essential (primary) hypertension: Secondary | ICD-10-CM | POA: Diagnosis not present

## 2018-09-07 DIAGNOSIS — Y9389 Activity, other specified: Secondary | ICD-10-CM | POA: Diagnosis not present

## 2018-09-07 DIAGNOSIS — E119 Type 2 diabetes mellitus without complications: Secondary | ICD-10-CM | POA: Diagnosis not present

## 2018-09-07 DIAGNOSIS — Y9289 Other specified places as the place of occurrence of the external cause: Secondary | ICD-10-CM | POA: Diagnosis not present

## 2018-09-07 DIAGNOSIS — S8982XA Other specified injuries of left lower leg, initial encounter: Secondary | ICD-10-CM | POA: Diagnosis not present

## 2018-09-07 DIAGNOSIS — M1990 Unspecified osteoarthritis, unspecified site: Secondary | ICD-10-CM | POA: Diagnosis not present

## 2018-09-21 DIAGNOSIS — E039 Hypothyroidism, unspecified: Secondary | ICD-10-CM | POA: Diagnosis not present

## 2018-09-21 DIAGNOSIS — I119 Hypertensive heart disease without heart failure: Secondary | ICD-10-CM | POA: Diagnosis not present

## 2018-09-21 DIAGNOSIS — Z6832 Body mass index (BMI) 32.0-32.9, adult: Secondary | ICD-10-CM | POA: Diagnosis not present

## 2018-09-21 DIAGNOSIS — I429 Cardiomyopathy, unspecified: Secondary | ICD-10-CM | POA: Diagnosis not present
